# Patient Record
Sex: Male | Born: 1995 | Race: White | Hispanic: No | Marital: Married | Smoking: Never smoker
Health system: Southern US, Community
[De-identification: ages and names within clinical notes are randomized; demographics above are authoritative.]

## PROBLEM LIST (undated history)

## (undated) DIAGNOSIS — K649 Unspecified hemorrhoids: Secondary | ICD-10-CM

## (undated) HISTORY — PX: COLONOSCOPY: SHX5424

## (undated) HISTORY — DX: Unspecified hemorrhoids: K64.9

## (undated) HISTORY — PX: WISDOM TOOTH EXTRACTION: SHX21

---
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
## 2016-06-25 ENCOUNTER — Ambulatory Visit (INDEPENDENT_AMBULATORY_CARE_PROVIDER_SITE_OTHER): Payer: Managed Care, Other (non HMO) | Admitting: Emergency Medicine

## 2016-06-25 VITALS — BP 112/70 | HR 61 | Temp 98.5°F | Resp 20 | Ht 71.0 in | Wt 173.0 lb

## 2016-06-25 DIAGNOSIS — Z0000 Encounter for general adult medical examination without abnormal findings: Secondary | ICD-10-CM | POA: Diagnosis not present

## 2016-06-25 NOTE — Progress Notes (Signed)
Randy Mayer 21 y.o.   Chief Complaint  Patient presents with  . Annual Exam    [facility] [employer]    HISTORY OF PRESENT ILLNESS: This is a 21 y.o. 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  HPI   Prior to Admission medications   Not on File    Not on File  There are no active problems to display for this patient.   No past medical history on file.  No past surgical history on file.  Social History   Social History  . Marital status: Single    Spouse name: N/A  . Number of children: N/A  . Years of education: N/A   Occupational History  . Not on file.   Social History Main Topics  . Smoking status: Not on file  . Smokeless tobacco: Not on file  . Alcohol use Not on file  . Drug use: Unknown  . Sexual activity: Not on file   Other Topics Concern  . Not on file   Social History Narrative  . No narrative on file    No family history on file.   Review of Systems  Constitutional: Negative.  Negative for chills, fever and weight loss.  HENT: Negative.  Negative for ear discharge, hearing loss, nosebleeds and sore throat.   Eyes: Negative.  Negative for blurred vision and double vision.  Respiratory: Negative.  Negative for cough and shortness of breath.   Cardiovascular: Negative.  Negative for chest pain and palpitations.  Gastrointestinal: Negative.  Negative for abdominal pain, diarrhea, nausea and vomiting.  Genitourinary: Negative.  Negative for dysuria and hematuria.  Musculoskeletal: Negative.  Negative for myalgias and neck pain.  Skin: Negative.  Negative for rash.  Neurological: Negative.  Negative for dizziness and headaches.  Endo/Heme/Allergies: Negative.   All other systems reviewed and are negative.  Vitals:   06/25/16 1515  BP: 112/70  Pulse: 61  Resp: 20  Temp: 98.5 F (36.9 C)     Physical Exam  Constitutional: He is oriented to person, place, and time. He appears well-developed and  well-nourished.  HENT:  Head: Normocephalic and atraumatic.  Nose: Nose normal.  Mouth/Throat: Oropharynx is clear and moist. No oropharyngeal exudate.  Eyes: Conjunctivae and EOM are normal. Pupils are equal, round, and reactive to light.  Neck: Normal range of motion. Neck supple. No JVD present.  Cardiovascular: Normal rate, regular rhythm, normal heart sounds and intact distal pulses.   Pulmonary/Chest: Effort normal and breath sounds normal.  Abdominal: Soft. Bowel sounds are normal. There is no tenderness.  Musculoskeletal: Normal range of motion.  Lymphadenopathy:    He has no cervical adenopathy.  Neurological: He is alert and oriented to person, place, and time. No sensory deficit. He exhibits normal muscle tone.  Skin: Skin is warm and dry. Capillary refill takes less than 2 seconds. No rash noted.  Psychiatric: He has a normal mood and affect. His behavior is normal.  Vitals reviewed.    ASSESSMENT & PLAN: Randy Mayer was seen today for annual exam.  Diagnoses and all orders for this visit:  Routine general medical examination at a health care facility    Patient Instructions       IF you received an x-ray today, you will receive an invoice from [facility]. Please contact [facility] at [phone] with questions or concerns regarding your invoice.   IF you received labwork today, you will receive an invoice from [facility]. Please contact [facility] at [phone]  with questions or concerns regarding your invoice.   Our billing staff will not be able to assist you with questions regarding bills from these companies.  You will be contacted with the lab results as soon as they are available. The fastest way to get your results is to activate your My Chart account. Instructions are located on the last page of this paperwork. If you have not heard from [place] regarding the results in 2 weeks, please contact this office.        Health Maintenance,  Male A healthy lifestyle and preventive care is important for your health and wellness. Ask your health care provider about what schedule of regular examinations is right for you. What should I know about weight and diet?  Eat a Healthy Diet  Eat plenty of vegetables, fruits, whole grains, low-fat dairy products, and lean protein.  Do not eat a lot of foods high in solid fats, added sugars, or salt. Maintain a Healthy Weight  Regular exercise can help you achieve or maintain a healthy weight. You should:  Do at least 150 minutes of exercise each week. The exercise should increase your heart rate and make you sweat (moderate-intensity exercise).  Do strength-training exercises at least twice a week. Watch Your Levels of Cholesterol and Blood Lipids  Have your blood tested for lipids and cholesterol every 5 years starting at 23 years of age. If you are at high risk for heart disease, you should start having your blood tested when you are 23 years old. You may need to have your cholesterol levels checked more often if:  Your lipid or cholesterol levels are high.  You are older than 23 years of age.  You are at high risk for heart disease. What should I know about cancer screening? Many types of cancers can be detected early and may often be prevented. Lung Cancer  You should be screened every year for lung cancer if:  You are a current smoker who has smoked for at least 30 years.  You are a former smoker who has quit within the past 15 years.  Talk to your health care provider about your screening options, when you should start screening, and how often you should be screened. Colorectal Cancer  Routine colorectal cancer screening usually begins at 23 years of age and should be repeated every 5-10 years until you are 23 years old. You may need to be screened more often if early forms of precancerous polyps or small growths are found. Your health care provider may recommend screening  at an earlier age if you have risk factors for colon cancer.  Your health care provider may recommend using home test kits to check for hidden blood in the stool.  A small camera at the end of a tube can be used to examine your colon (sigmoidoscopy or colonoscopy). This checks for the earliest forms of colorectal cancer. Prostate and Testicular Cancer  Depending on your age and overall health, your health care provider may do certain tests to screen for prostate and testicular cancer.  Talk to your health care provider about any symptoms or concerns you have about testicular or prostate cancer. Skin Cancer  Check your skin from head to toe regularly.  Tell your health care provider about any new moles or changes in moles, especially if:  There is a change in a mole's size, shape, or color.  You have a mole that is larger than a pencil eraser.  Always use sunscreen. Apply  sunscreen liberally and repeat throughout the day.  Protect yourself by wearing long sleeves, pants, a wide-brimmed hat, and sunglasses when outside. What should I know about heart disease, diabetes, and high blood pressure?  If you are 50-78 years of age, have your blood pressure checked every 3-5 years. If you are 64 years of age or older, have your blood pressure checked every year. You should have your blood pressure measured twice-once when you are at a hospital or clinic, and once when you are not at a hospital or clinic. Record the average of the two measurements. To check your blood pressure when you are not at a hospital or clinic, you can use:  An automated blood pressure machine at a pharmacy.  A home blood pressure monitor.  Talk to your health care provider about your target blood pressure.  If you are between 36-14 years old, ask your health care provider if you should take aspirin to prevent heart disease.  Have regular diabetes screenings by checking your fasting blood sugar level.  If you are at  a normal weight and have a low risk for diabetes, have this test once every three years after the age of 35.  If you are overweight and have a high risk for diabetes, consider being tested at a younger age or more often.  A one-time screening for abdominal aortic aneurysm (AAA) by ultrasound is recommended for men aged 65-75 years who are current or former smokers. What should I know about preventing infection? Hepatitis B  If you have a higher risk for hepatitis B, you should be screened for this virus. Talk with your health care provider to find out if you are at risk for hepatitis B infection. Hepatitis C  Blood testing is recommended for:  Everyone born from [birth year] through 1967.  Anyone with known risk factors for hepatitis C. Sexually Transmitted Diseases (STDs)  You should be screened each year for STDs including gonorrhea and chlamydia if:  You are sexually active and are younger than 23 years of age.  You are older than 23 years of age and your health care provider tells you that you are at risk for this type of infection.  Your sexual activity has changed since you were last screened and you are at an increased risk for chlamydia or gonorrhea. Ask your health care provider if you are at risk.  Talk with your health care provider about whether you are at high risk of being infected with HIV. Your health care provider may recommend a prescription medicine to help prevent HIV infection. What else can I do?  Schedule regular health, dental, and eye exams.  Stay current with your vaccines (immunizations).  Do not use any tobacco products, such as cigarettes, chewing tobacco, and e-cigarettes. If you need help quitting, ask your health care provider.  Limit alcohol intake to no more than 2 drinks per day. One drink equals 12 ounces of beer, 5 ounces of wine, or 1 ounces of hard liquor.  Do not use street drugs.  Do not share needles.  Ask your health care provider for help if  you need support or information about quitting drugs.  Tell your health care provider if you often feel depressed.  Tell your health care provider if you have ever been abused or do not feel safe at home. This information is not intended to replace advice given to you by your health care provider. Make sure you discuss any questions you have with your  health care provider. Document Released: 07/19/2007 Document Revised: 09/19/2015 Document Reviewed: 10/24/2014 Elsevier Interactive Patient Education  2017 Elsevier Inc.  American Heart Association [facility](AHA) Exercise Recommendation  Being physically active is important to prevent heart disease and stroke, the nation's No. 1and No. 5killers. To improve overall cardiovascular health, we suggest at least 150 minutes per week of moderate exercise or 75 minutes per week of vigorous exercise (or a combination of moderate and vigorous activity). Thirty minutes a day, five times a week is an easy goal to remember. You will also experience benefits even if you divide your time into two or three segments of 10 to 15 minutes per day.  For people who would benefit from lowering their blood pressure or cholesterol, we recommend 40 minutes of aerobic exercise of moderate to vigorous intensity three to four times a week to lower the risk for heart attack and stroke.  Physical activity is anything that makes you move your body and burn calories.  This includes things like climbing stairs or playing sports. Aerobic exercises benefit your heart, and include walking, jogging, swimming or biking. Strength and stretching exercises are best for overall stamina and flexibility.  The simplest, positive change you can make to effectively improve your heart health is to start walking. It's enjoyable, free, easy, social and great exercise. A walking program is flexible and boasts high success rates because people can stick with it. It's easy for walking to become a regular and  satisfying part of life.   For Overall Cardiovascular Health:  At least 30 minutes of moderate-intensity aerobic activity at least 5 days per week for a total of 150  OR   At least 25 minutes of vigorous aerobic activity at least 3 days per week for a total of 75 minutes; or a combination of moderate- and vigorous-intensity aerobic activity  AND   Moderate- to high-intensity muscle-strengthening activity at least 2 days per week for additional health benefits.  For Lowering Blood Pressure and Cholesterol  An average 40 minutes of moderate- to vigorous-intensity aerobic activity 3 or 4 times per week  What if I can't make it to the time goal? Something is always better than nothing! And everyone has to start somewhere. Even if you've been sedentary for years, today is the day you can begin to make healthy changes in your life. If you don't think you'll make it for 30 or 40 minutes, set a reachable goal for today. You can work up toward your overall goal by increasing your time as you get stronger. Don't let all-or-nothing thinking rob you of doing what you can every day.  Source:http://www.heart.[place]        Jazlin Tapscott, MD [facility]

## 2016-06-25 NOTE — Patient Instructions (Addendum)
   IF you received an x-ray today, you will receive an invoice from [facility]. Please contact [facility] at [phone] with questions or concerns regarding your invoice.   IF you received labwork today, you will receive an invoice from [facility]. Please contact [facility] at [phone] with questions or concerns regarding your invoice.   Our billing staff will not be able to assist you with questions regarding bills from these companies.  You will be contacted with the lab results as soon as they are available. The fastest way to get your results is to activate your My Chart account. Instructions are located on the last page of this paperwork. If you have not heard from us regarding the results in 2 weeks, please contact this office.        Health Maintenance, Male A healthy lifestyle and preventive care is important for your health and wellness. Ask your health care provider about what schedule of regular examinations is right for you. What should I know about weight and diet?  Eat a Healthy Diet  Eat plenty of vegetables, fruits, whole grains, low-fat dairy products, and lean protein.  Do not eat a lot of foods high in solid fats, added sugars, or salt. Maintain a Healthy Weight  Regular exercise can help you achieve or maintain a healthy weight. You should:  Do at least 150 minutes of exercise each week. The exercise should increase your heart rate and make you sweat (moderate-intensity exercise).  Do strength-training exercises at least twice a week. Watch Your Levels of Cholesterol and Blood Lipids  Have your blood tested for lipids and cholesterol every 5 years starting at 23 years of age. If you are at high risk for heart disease, you should start having your blood tested when you are 23 years old. You may need to have your cholesterol levels checked more often if:  Your lipid or cholesterol levels are high.  You are older than 23 years of  age.  You are at high risk for heart disease. What should I know about cancer screening? Many types of cancers can be detected early and may often be prevented. Lung Cancer  You should be screened every year for lung cancer if:  You are a current smoker who has smoked for at least 30 years.  You are a former smoker who has quit within the past 15 years.  Talk to your health care provider about your screening options, when you should start screening, and how often you should be screened. Colorectal Cancer  Routine colorectal cancer screening usually begins at 23 years of age and should be repeated every 5-10 years until you are 23 years old. You may need to be screened more often if early forms of precancerous polyps or small growths are found. Your health care provider may recommend screening at an earlier age if you have risk factors for colon cancer.  Your health care provider may recommend using home test kits to check for hidden blood in the stool.  A small camera at the end of a tube can be used to examine your colon (sigmoidoscopy or colonoscopy). This checks for the earliest forms of colorectal cancer. Prostate and Testicular Cancer  Depending on your age and overall health, your health care provider may do certain tests to screen for prostate and testicular cancer.  Talk to your health care provider about any symptoms or concerns you have about testicular or prostate cancer. Skin Cancer  Check your skin from head   to toe regularly.  Tell your health care provider about any new moles or changes in moles, especially if:  There is a change in a mole's size, shape, or color.  You have a mole that is larger than a pencil eraser.  Always use sunscreen. Apply sunscreen liberally and repeat throughout the day.  Protect yourself by wearing long sleeves, pants, a wide-brimmed hat, and sunglasses when outside. What should I know about heart disease, diabetes, and high blood  pressure?  If you are 18-39 years of age, have your blood pressure checked every 3-5 years. If you are 40 years of age or older, have your blood pressure checked every year. You should have your blood pressure measured twice-once when you are at a hospital or clinic, and once when you are not at a hospital or clinic. Record the average of the two measurements. To check your blood pressure when you are not at a hospital or clinic, you can use:  An automated blood pressure machine at a pharmacy.  A home blood pressure monitor.  Talk to your health care provider about your target blood pressure.  If you are between 45-79 years old, ask your health care provider if you should take aspirin to prevent heart disease.  Have regular diabetes screenings by checking your fasting blood sugar level.  If you are at a normal weight and have a low risk for diabetes, have this test once every three years after the age of 45.  If you are overweight and have a high risk for diabetes, consider being tested at a younger age or more often.  A one-time screening for abdominal aortic aneurysm (AAA) by ultrasound is recommended for men aged 65-75 years who are current or former smokers. What should I know about preventing infection? Hepatitis B  If you have a higher risk for hepatitis B, you should be screened for this virus. Talk with your health care provider to find out if you are at risk for hepatitis B infection. Hepatitis C  Blood testing is recommended for:  Everyone born from 1945 through 1965.  Anyone with known risk factors for hepatitis C. Sexually Transmitted Diseases (STDs)  You should be screened each year for STDs including gonorrhea and chlamydia if:  You are sexually active and are younger than 24 years of age.  You are older than 24 years of age and your health care provider tells you that you are at risk for this type of infection.  Your sexual activity has changed since you were last  screened and you are at an increased risk for chlamydia or gonorrhea. Ask your health care provider if you are at risk.  Talk with your health care provider about whether you are at high risk of being infected with HIV. Your health care provider may recommend a prescription medicine to help prevent HIV infection. What else can I do?  Schedule regular health, dental, and eye exams.  Stay current with your vaccines (immunizations).  Do not use any tobacco products, such as cigarettes, chewing tobacco, and e-cigarettes. If you need help quitting, ask your health care provider.  Limit alcohol intake to no more than 2 drinks per day. One drink equals 12 ounces of beer, 5 ounces of wine, or 1 ounces of hard liquor.  Do not use street drugs.  Do not share needles.  Ask your health care provider for help if you need support or information about quitting drugs.  Tell your health care provider if you   often feel depressed.  Tell your health care provider if you have ever been abused or do not feel safe at home. This information is not intended to replace advice given to you by your health care provider. Make sure you discuss any questions you have with your health care provider. Document Released: 07/19/2007 Document Revised: 09/19/2015 Document Reviewed: 10/24/2014 Elsevier Interactive Patient Education  2017 Elsevier Inc.  American Heart Association (AHA) Exercise Recommendation  Being physically active is important to prevent heart disease and stroke, the nation's No. 1and No. 5killers. To improve overall cardiovascular health, we suggest at least 150 minutes per week of moderate exercise or 75 minutes per week of vigorous exercise (or a combination of moderate and vigorous activity). Thirty minutes a day, five times a week is an easy goal to remember. You will also experience benefits even if you divide your time into two or three segments of 10 to 15 minutes per day.  For people who would  benefit from lowering their blood pressure or cholesterol, we recommend 40 minutes of aerobic exercise of moderate to vigorous intensity three to four times a week to lower the risk for heart attack and stroke.  Physical activity is anything that makes you move your body and burn calories.  This includes things like climbing stairs or playing sports. Aerobic exercises benefit your heart, and include walking, jogging, swimming or biking. Strength and stretching exercises are best for overall stamina and flexibility.  The simplest, positive change you can make to effectively improve your heart health is to start walking. It's enjoyable, free, easy, social and great exercise. A walking program is flexible and boasts high success rates because people can stick with it. It's easy for walking to become a regular and satisfying part of life.   For Overall Cardiovascular Health:  At least 30 minutes of moderate-intensity aerobic activity at least 5 days per week for a total of 150  OR   At least 25 minutes of vigorous aerobic activity at least 3 days per week for a total of 75 minutes; or a combination of moderate- and vigorous-intensity aerobic activity  AND   Moderate- to high-intensity muscle-strengthening activity at least 2 days per week for additional health benefits.  For Lowering Blood Pressure and Cholesterol  An average 40 minutes of moderate- to vigorous-intensity aerobic activity 3 or 4 times per week  What if I can't make it to the time goal? Something is always better than nothing! And everyone has to start somewhere. Even if you've been sedentary for years, today is the day you can begin to make healthy changes in your life. If you don't think you'll make it for 30 or 40 minutes, set a reachable goal for today. You can work up toward your overall goal by increasing your time as you get stronger. Don't let all-or-nothing thinking rob you of doing what you can every day.   Source:http://www.heart.org    

## 2018-02-11 ENCOUNTER — Telehealth: Payer: Self-pay | Admitting: General Practice

## 2018-02-11 NOTE — Telephone Encounter (Signed)
Copied from CRM [id number]. Topic: Appointment Scheduling - Scheduling Inquiry for Clinic >> Feb 11, 2018  2:59 PM Crist Infante wrote: Reason for CRM: Dad is a pt of Dr Jimmey Ralph.  Pt is experiencing IBS sx. Pt is in a specialized training program with the [place] [employer], and his schedule varies.  But he has tomorrow morning off. Dad wants to know if Dr Jimmey Ralph will see him tomorrow in the 11:20 time slot.  That is all that is open.  (it is a same day) Pt has to be at work at pm, so earlier would be better.  Dad states it is is getting worse every day for the pt. (dad not with him now) was hoping Dr Jimmey Ralph could see him though. pt does not know when he will get another day off.

## 2018-02-12 ENCOUNTER — Ambulatory Visit: Payer: Self-pay | Admitting: Family Medicine

## 2019-12-20 ENCOUNTER — Ambulatory Visit: Payer: Self-pay | Admitting: Family Medicine

## 2020-02-04 DIAGNOSIS — K589 Irritable bowel syndrome without diarrhea: Secondary | ICD-10-CM

## 2020-02-04 HISTORY — DX: Irritable bowel syndrome without diarrhea: K58.9

## 2020-03-01 ENCOUNTER — Encounter: Payer: Self-pay | Admitting: Family Medicine

## 2020-03-01 ENCOUNTER — Other Ambulatory Visit: Payer: Self-pay

## 2020-03-01 ENCOUNTER — Ambulatory Visit (INDEPENDENT_AMBULATORY_CARE_PROVIDER_SITE_OTHER): Payer: Managed Care, Other (non HMO) | Admitting: Family Medicine

## 2020-03-01 VITALS — BP 106/69 | HR 63 | Temp 97.9°F | Ht 71.0 in | Wt 167.6 lb

## 2020-03-01 DIAGNOSIS — R197 Diarrhea, unspecified: Secondary | ICD-10-CM | POA: Diagnosis not present

## 2020-03-01 DIAGNOSIS — Z13220 Encounter for screening for lipoid disorders: Secondary | ICD-10-CM

## 2020-03-01 DIAGNOSIS — Z0001 Encounter for general adult medical examination with abnormal findings: Secondary | ICD-10-CM

## 2020-03-01 DIAGNOSIS — K580 Irritable bowel syndrome with diarrhea: Secondary | ICD-10-CM | POA: Insufficient documentation

## 2020-03-01 LAB — CBC
HCT: 44.2 % (ref 39.0–52.0)
Hemoglobin: 14.9 g/dL (ref 13.0–17.0)
MCHC: 33.8 g/dL (ref 30.0–36.0)
MCV: 92.4 fl (ref 78.0–100.0)
Platelets: 157 10*3/uL (ref 150.0–400.0)
RBC: 4.79 Mil/uL (ref 4.22–5.81)
RDW: 12.8 % (ref 11.5–15.5)
WBC: 3.5 10*3/uL — ABNORMAL LOW (ref 4.0–10.5)

## 2020-03-01 LAB — COMPREHENSIVE METABOLIC PANEL
ALT: 21 U/L (ref 0–53)
AST: 20 U/L (ref 0–37)
Albumin: 4.4 g/dL (ref 3.5–5.2)
Alkaline Phosphatase: 65 U/L (ref 39–117)
BUN: 29 mg/dL — ABNORMAL HIGH (ref 6–23)
CO2: 30 mEq/L (ref 19–32)
Calcium: 9.6 mg/dL (ref 8.4–10.5)
Chloride: 104 mEq/L (ref 96–112)
Creatinine, Ser: 1.09 mg/dL (ref 0.40–1.50)
GFR: 93.86 mL/min (ref >60.00)
Glucose, Bld: 88 mg/dL (ref 70–99)
Potassium: 3.9 mEq/L (ref 3.5–5.1)
Sodium: 140 mEq/L (ref 135–145)
Total Bilirubin: 0.8 mg/dL (ref 0.2–1.2)
Total Protein: 6.7 g/dL (ref 6.0–8.3)

## 2020-03-01 LAB — LIPID PANEL
Cholesterol: 175 mg/dL (ref 0–200)
HDL: 56.5 mg/dL (ref >39.00)
LDL Cholesterol: 98 mg/dL (ref 0–99)
NonHDL: 118.57
Total CHOL/HDL Ratio: 3
Triglycerides: 102 mg/dL (ref 0.0–149.0)
VLDL: 20.4 mg/dL (ref 0.0–40.0)

## 2020-03-01 LAB — TSH: TSH: 8.02 u[IU]/mL — ABNORMAL HIGH (ref 0.35–4.50)

## 2020-03-01 NOTE — Assessment & Plan Note (Signed)
Probable IBS.  Will check labs today.  Recommended FODMAP eating plan and peppermint oil.  Given his unintentional weight loss will also place referral to GI.

## 2020-03-01 NOTE — Progress Notes (Signed)
Chief Complaint:  Randy Mayer is a 25 y.o. male who presents today for his annual comprehensive physical exam.    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  Will check labs today.  Recommended FODMAP eating plan and peppermint oil.  Given his unintentional weight loss will also place referral to GI.  Preventative Healthcare: Check labs today.  Patient Counseling(The following topics were reviewed and/or handout was given):  -Nutrition: Stressed importance of moderation in sodium/caffeine intake, saturated fat and cholesterol, caloric balance, sufficient intake of fresh fruits, vegetables, and fiber.  -Stressed the importance of regular exercise.   -Substance Abuse: Discussed cessation/primary prevention of tobacco, alcohol, or other drug use; driving or other dangerous activities under the influence; availability of treatment for abuse.   -Injury prevention: Discussed safety belts, safety helmets, smoke detector, smoking near bedding or upholstery.   -Sexuality: Discussed sexually transmitted diseases, partner selection, use of condoms, avoidance of unintended pregnancy and contraceptive alternatives.   -Dental health: Discussed importance of regular tooth brushing, flossing, and dental visits.  -Health maintenance and immunizations reviewed. Please refer to Health maintenance section.  Return to care in 1 year for next preventative visit.     Subjective:  HPI:  He has no acute complaints today.  He has had some concern for IBS for the last several years.  Several times per week he will wake up in the morning with abdominal pain and have episode of diarrhea.  He has not noticed any blood or mucus in the stool.  No fevers or chills.  He has had about a 15 pound unintentional weight loss over the last several months.  He thought this may have been due to COVID but his other symptoms have resolved and he has had a difficult time putting weight back  on.  Lifestyle Diet: Balanced.  Exercise: Goes to gym regularly.   Depression screen PHQ 2/9 03/01/2020  Decreased Interest 0  Down, Depressed, Hopeless 0  PHQ - 2 Score 0    Health Maintenance Due  Topic Date Due  . Hepatitis C Screening  Never done  . HIV Screening  Never done     ROS: Per HPI, otherwise a complete review of systems was negative.   PMH:  The following were reviewed and entered/updated in epic: History reviewed. No pertinent past medical history. Patient Active Problem List   Diagnosis Date Noted  . Diarrhea 03/01/2020   History reviewed. No pertinent surgical history.  History reviewed. No pertinent family history.  Medications- reviewed and updated No current outpatient medications on file.   No current facility-administered medications for this visit.    Allergies-reviewed and updated No Known Allergies  Social History   Socioeconomic History  . Marital status: Single    Spouse name: Not on file  . Number of children: Not on file  . Years of education: Not on file  . Highest education level: Not on file  Occupational History  . Not on file  Tobacco Use  . Smoking status: Not on file  . Smokeless tobacco: Not on file  Substance and Sexual Activity  . Alcohol use: Yes    Comment: OCC  . Drug use: Never  . Sexual activity: Not on file  Other Topics Concern  . Not on file  Social History Narrative  . Not on file   Social Determinants of Health   Financial Resource Strain: Not on file  Food Insecurity: Not on file  Transportation Needs: Not on file  Physical Activity: Not on file  Stress: Not on file  Social Connections: Not on file        Objective:  Physical Exam: BP 106/69   Pulse 63   Temp 97.9 F (36.6 C) (Temporal)   Ht 5\' 11"  (1.803 m)   Wt 167 lb 9.6 oz (76 kg)   SpO2 99%   BMI 23.38 kg/m   Body mass index is 23.38 kg/m. Wt Readings from Last 3 Encounters:  03/01/20 167 lb 9.6 oz (76 kg)  06/25/16 173 lb  (78.5 kg)   Gen: NAD, resting comfortably HEENT: TMs normal bilaterally. OP clear. No thyromegaly noted.  CV: RRR with no murmurs appreciated Pulm: NWOB, CTAB with no crackles, wheezes, or rhonchi GI: Normal bowel sounds present. Soft, Nontender, Nondistended. MSK: no edema, cyanosis, or clubbing noted Skin: warm, dry Neuro: CN2-12 grossly intact. Strength 5/5 in upper and lower extremities. Reflexes symmetric and intact bilaterally.  Psych: Normal affect and thought content     Randy M. 06/27/16, MD 03/01/2020 9:20 AM

## 2020-03-01 NOTE — Patient Instructions (Signed)
It was very nice to see you today!  We will check blood work today and place referral for you to see a gastroenterologist.  Please try the FODMAP eating plan below.  Please also try taking peppermint oil to see if this helps with your digestive issues. You can try IBgard to see if it helps.  I will see you back in year for your next annual checkup.  Please come back to see me sooner if needed.  Take care, Dr Jimmey Ralph  Please try these tips to maintain a healthy lifestyle:   Eat at least 3 REAL meals and 1-2 snacks per day.  Aim for no more than 5 hours between eating.  If you eat breakfast, please do so within one hour of getting up.    Each meal should contain half fruits/vegetables, one quarter protein, and one quarter carbs (no bigger than a computer mouse)   Cut down on sweet beverages. This includes juice, soda, and sweet tea.     Drink at least 1 glass of water with each meal and aim for at least 8 glasses per day   Exercise at least 150 minutes every week.    Preventive Care 67-21 Years Old, Male Preventive care refers to lifestyle choices and visits with your health care provider that can promote health and wellness. This includes:  A yearly physical exam. This is also called an annual wellness visit.  Regular dental and eye exams.  Immunizations.  Screening for certain conditions.  Healthy lifestyle choices, such as: ? Eating a healthy diet. ? Getting regular exercise. ? Not using drugs or products that contain nicotine and tobacco. ? Limiting alcohol use. What can I expect for my preventive care visit? Physical exam Your health care provider may check your:  Height and weight. These may be used to calculate your BMI (body mass index). BMI is a measurement that tells if you are at a healthy weight.  Heart rate and blood pressure.  Body temperature.  Skin for abnormal spots. Counseling Your health care provider may ask you questions about your:  Past  medical problems.  Family's medical history.  Alcohol, tobacco, and drug use.  Emotional well-being.  Home life and relationship well-being.  Sexual activity.  Diet, exercise, and sleep habits.  Work and work Astronomer.  Access to firearms. What immunizations do I need? Vaccines are usually given at various ages, according to a schedule. Your health care provider will recommend vaccines for you based on your age, medical history, and lifestyle or other factors, such as travel or where you work.   What tests do I need? Blood tests  Lipid and cholesterol levels. These may be checked every 5 years starting at age 59.  Hepatitis C test.  Hepatitis B test. Screening  Diabetes screening. This is done by checking your blood sugar (glucose) after you have not eaten for a while (fasting).  Genital exam to check for testicular cancer or hernias.  STD (sexually transmitted disease) testing, if you are at risk. Talk with your health care provider about your test results, treatment options, and if necessary, the need for more tests.   Follow these instructions at home: Eating and drinking  Eat a healthy diet that includes fresh fruits and vegetables, whole grains, lean protein, and low-fat dairy products.  Drink enough fluid to keep your urine pale yellow.  Take vitamin and mineral supplements as recommended by your health care provider.  Do not drink alcohol if your health care provider  tells you not to drink.  If you drink alcohol: ? Limit how much you have to 0-2 drinks a day. ? Be aware of how much alcohol is in your drink. In the [place] one drink equals one 12 oz bottle of beer (355 mL), one 5 oz glass of wine (148 mL), or one 1 oz glass of hard liquor (44 mL).   Lifestyle  Take daily care of your teeth and gums. Brush your teeth every morning and night with fluoride toothpaste. Floss one time each day.  Stay active. Exercise for at least 30 minutes 5 or more days each  week.  Do not use any products that contain nicotine or tobacco, such as cigarettes, e-cigarettes, and chewing tobacco. If you need help quitting, ask your health care provider.  Do not use drugs.  If you are sexually active, practice safe sex. Use a condom or other form of protection to prevent STIs (sexually transmitted infections).  Find healthy ways to cope with stress, such as: ? Meditation, yoga, or listening to music. ? Journaling. ? Talking to a trusted person. ? Spending time with friends and family. Safety  Always wear your seat belt while driving or riding in a vehicle.  Do not drive: ? If you have been drinking alcohol. Do not ride with someone who has been drinking. ? When you are tired or distracted. ? While texting.  Wear a helmet and other protective equipment during sports activities.  If you have firearms in your house, make sure you follow all gun safety procedures.  Seek help if you have been physically or sexually abused. What's next?  Go to your health care provider once a year for an annual wellness visit.  Ask your health care provider how often you should have your eyes and teeth checked.  Stay up to date on all vaccines. This information is not intended to replace advice given to you by your health care provider. Make sure you discuss any questions you have with your health care provider. Document Revised: 10/06/2018 Document Reviewed: 01/14/2018 Elsevier Patient Education  2021 Elsevier Inc.   Low-FODMAP Eating Plan  FODMAP stands for fermentable oligosaccharides, disaccharides, monosaccharides, and polyols. These are sugars that are hard for some people to digest. A low-FODMAP eating plan may help some people who have irritable bowel syndrome (IBS) and certain other bowel (intestinal) diseases to manage their symptoms. This meal plan can be complicated to follow. Work with a diet and nutrition specialist (dietitian) to make a low-FODMAP eating  plan that is right for you. A dietitian can help make sure that you get enough nutrition from this diet. What are tips for following this plan? Reading food labels  Check labels for hidden FODMAPs such as: ? High-fructose syrup. ? Honey. ? Agave. ? Natural fruit flavors. ? Onion or garlic powder.  Choose low-FODMAP foods that contain 3-4 grams of fiber per serving.  Check food labels for serving sizes. Eat only one serving at a time to make sure FODMAP levels stay low. Shopping  Shop with a list of foods that are recommended on this diet and make a meal plan. Meal planning  Follow a low-FODMAP eating plan for up to 6 weeks, or as told by your health care provider or dietitian.  To follow the eating plan: 1. Eliminate high-FODMAP foods from your diet completely. Choose only low-FODMAP foods to eat. You will do this for 2-6 weeks. 2. Gradually reintroduce high-FODMAP foods into your diet one at a time.  Most people should wait a few days before introducing the next new high-FODMAP food into their meal plan. Your dietitian can recommend how quickly you may reintroduce foods. 3. Keep a daily record of what and how much you eat and drink. Make note of any symptoms that you have after eating. 4. Review your daily record with a dietitian regularly to identify which foods you can eat and which foods you should avoid. General tips  Drink enough fluid each day to keep your urine pale yellow.  Avoid processed foods. These often have added sugar and may be high in FODMAPs.  Avoid most dairy products, whole grains, and sweeteners.  Work with a dietitian to make sure you get enough fiber in your diet.  Avoid high FODMAP foods at meals to manage symptoms. Recommended foods Fruits Bananas, oranges, tangerines, lemons, limes, blueberries, raspberries, strawberries, grapes, cantaloupe, honeydew melon, kiwi, papaya, passion fruit, and pineapple. Limited amounts of dried cranberries, banana chips,  and shredded coconut. Vegetables Eggplant, zucchini, cucumber, peppers, green beans, bean sprouts, lettuce, arugula, kale, Swiss chard, spinach, collard greens, bok choy, summer squash, potato, and tomato. Limited amounts of corn, carrot, and sweet potato. Green parts of scallions. Grains Gluten-free grains, such as rice, oats, buckwheat, quinoa, corn, polenta, and millet. Gluten-free pasta, bread, or cereal. Rice noodles. Corn tortillas. Meats and other proteins Unseasoned beef, pork, poultry, or fish. Eggs. Tomasa Blase. Tofu (firm) and tempeh. Limited amounts of nuts and seeds, such as almonds, walnuts, [place] nuts, pecans, peanuts, nut butters, pumpkin seeds, chia seeds, and sunflower seeds. Dairy Lactose-free milk, yogurt, and kefir. Lactose-free cottage cheese and ice cream. Non-dairy milks, such as almond, coconut, hemp, and rice milk. Non-dairy yogurt. Limited amounts of goat cheese, brie, mozzarella, parmesan, swiss, and other hard cheeses. Fats and oils Butter-free spreads. Vegetable oils, such as olive, canola, and sunflower oil. Seasoning and other foods Artificial sweeteners with names that do not end in "ol," such as aspartame, saccharine, and stevia. Maple syrup, white table sugar, raw sugar, brown sugar, and molasses. Mayonnaise, soy sauce, and tamari. Fresh basil, coriander, parsley, rosemary, and thyme. Beverages Water and mineral water. Sugar-sweetened soft drinks. Small amounts of orange juice or cranberry juice. Black and green tea. Most dry wines. Coffee. The items listed above may not be a complete list of foods and beverages you can eat. Contact a dietitian for more information. Foods to avoid Fruits Fresh, dried, and juiced forms of apple, pear, watermelon, peach, plum, cherries, apricots, blackberries, boysenberries, figs, nectarines, and mango. Avocado. Vegetables Chicory root, artichoke, asparagus, cabbage, snow peas, Brussels sprouts, broccoli, sugar snap peas, mushrooms,  celery, and cauliflower. Onions, garlic, leeks, and the white part of scallions. Grains Wheat, including kamut, durum, and semolina. Barley and bulgur. Couscous. Wheat-based cereals. Wheat noodles, bread, crackers, and pastries. Meats and other proteins Fried or fatty meat. Sausage. Cashews and pistachios. Soybeans, baked beans, black beans, chickpeas, kidney beans, fava beans, navy beans, lentils, black-eyed peas, and split peas. Dairy Milk, yogurt, ice cream, and soft cheese. Cream and sour cream. Milk-based sauces. Custard. Buttermilk. Soy milk. Seasoning and other foods Any sugar-free gum or candy. Foods that contain artificial sweeteners such as sorbitol, mannitol, isomalt, or xylitol. Foods that contain honey, high-fructose corn syrup, or agave. Bouillon, vegetable stock, beef stock, and chicken stock. Garlic and onion powder. Condiments made with onion, such as hummus, chutney, pickles, relish, salad dressing, and salsa. Tomato paste. Beverages Chicory-based drinks. Coffee substitutes. Chamomile tea. Fennel tea. Sweet or fortified wines such as port  or sherry. Diet soft drinks made with isomalt, mannitol, maltitol, sorbitol, or xylitol. Apple, pear, and mango juice. Juices with high-fructose corn syrup. The items listed above may not be a complete list of foods and beverages you should avoid. Contact a dietitian for more information. Summary  FODMAP stands for fermentable oligosaccharides, disaccharides, monosaccharides, and polyols. These are sugars that are hard for some people to digest.  A low-FODMAP eating plan is a short-term diet that helps to ease symptoms of certain bowel diseases.  The eating plan usually lasts up to 6 weeks. After that, high-FODMAP foods are reintroduced gradually and one at a time. This can help you find out which foods may be causing symptoms.  A low-FODMAP eating plan can be complicated. It is best to work with a dietitian who has experience with this type of  plan. This information is not intended to replace advice given to you by your health care provider. Make sure you discuss any questions you have with your health care provider. Document Revised: 06/09/2019 Document Reviewed: 06/09/2019 Elsevier Patient Education  2021 [employer].

## 2020-03-02 ENCOUNTER — Other Ambulatory Visit: Payer: Self-pay | Admitting: *Deleted

## 2020-03-02 ENCOUNTER — Other Ambulatory Visit (INDEPENDENT_AMBULATORY_CARE_PROVIDER_SITE_OTHER): Payer: Managed Care, Other (non HMO)

## 2020-03-02 ENCOUNTER — Other Ambulatory Visit: Payer: Managed Care, Other (non HMO)

## 2020-03-02 ENCOUNTER — Other Ambulatory Visit: Payer: Self-pay

## 2020-03-02 DIAGNOSIS — Z0001 Encounter for general adult medical examination with abnormal findings: Secondary | ICD-10-CM

## 2020-03-02 DIAGNOSIS — R946 Abnormal results of thyroid function studies: Secondary | ICD-10-CM

## 2020-03-02 NOTE — Progress Notes (Signed)
Please inform patient of the following:  Thyroid level is off. That could explain some of his symptoms. Everything else is NORMAL.  Would like for him to come back in to recheck. Please place order for TSH, free t4 and free t3.  Katina Degree. Jimmey Ralph, MD 03/02/2020 1:09 PM

## 2020-03-02 NOTE — Addendum Note (Signed)
Addended by: Cleda Mccreedy F on: 03/02/2020 02:57 PM   Modules accepted: Orders

## 2020-03-05 ENCOUNTER — Telehealth: Payer: Self-pay

## 2020-03-05 LAB — ANA: Anti Nuclear Antibody (ANA): POSITIVE — AB

## 2020-03-05 LAB — ANTI-NUCLEAR AB-TITER (ANA TITER): ANA Titer 1: 1:80 {titer} — ABNORMAL HIGH

## 2020-03-05 NOTE — Telephone Encounter (Signed)
Patients mother was calling regarding lab results. 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

## 2020-03-05 NOTE — Telephone Encounter (Signed)
Please see result note.  Randy Mayer. Jimmey Ralph, MD 03/05/2020 4:09 PM

## 2020-03-05 NOTE — Progress Notes (Signed)
Please inform patient of the following:  ANA is borderline elevated. We are still waiting on his thryoid results but next step would be rheumatology referral if he is interested.   Randy Mayer. Jimmey Ralph, MD 03/05/2020 3:53 PM

## 2020-03-05 NOTE — Telephone Encounter (Signed)
See note

## 2020-03-06 NOTE — Telephone Encounter (Signed)
See results note. 

## 2020-03-12 NOTE — Telephone Encounter (Signed)
Can we check with lab? Not sure why the results havent come back yet.  Katina Degree. Jimmey Ralph, MD 03/12/2020 12:27 PM

## 2020-03-12 NOTE — Telephone Encounter (Signed)
See note

## 2020-03-12 NOTE — Telephone Encounter (Signed)
Patient's mother is calling in stating son has lost 5 more pounds and is wondering if all lab results have a call back.

## 2020-03-14 NOTE — Telephone Encounter (Signed)
Please advise of labs.

## 2020-03-15 NOTE — Telephone Encounter (Signed)
Lupita Leash from the lab  spoke with [facility] they said they sent over a tube to [facility] for the t3, and t4 I called [facility] and they are faxing over results for the ANA but no sample left to do the T3 and T4... he will need to be redrawn.

## 2020-03-21 ENCOUNTER — Telehealth: Payer: Self-pay

## 2020-03-21 NOTE — Telephone Encounter (Signed)
Schedule appointment for lab tomorrow

## 2020-03-21 NOTE — Telephone Encounter (Signed)
Pt is requesting a call back from [facility] after 1 today.

## 2020-03-21 NOTE — Telephone Encounter (Signed)
See below

## 2020-03-22 ENCOUNTER — Other Ambulatory Visit (INDEPENDENT_AMBULATORY_CARE_PROVIDER_SITE_OTHER): Payer: Managed Care, Other (non HMO)

## 2020-03-22 ENCOUNTER — Other Ambulatory Visit: Payer: Self-pay

## 2020-03-22 DIAGNOSIS — R946 Abnormal results of thyroid function studies: Secondary | ICD-10-CM | POA: Diagnosis not present

## 2020-03-22 LAB — TSH: TSH: 4.51 u[IU]/mL — ABNORMAL HIGH (ref 0.35–4.50)

## 2020-03-23 LAB — T3: T3, Total: 99 ng/dL (ref 76–181)

## 2020-03-23 LAB — T4: T4, Total: 5.7 ug/dL (ref 4.9–10.5)

## 2020-03-23 NOTE — Progress Notes (Signed)
Please inform patient of the following:  Thyroid levels are going back to NORMAL. We should recheck again in about 3-6 months but do not need to do anything else with his thryoid at this point.  Can we please make sure the rheumatology referral has been done for his positive ANA?  Katina Degree. Jimmey Ralph, MD 03/23/2020 9:35 AM

## 2020-04-17 ENCOUNTER — Ambulatory Visit: Payer: Managed Care, Other (non HMO) | Admitting: Gastroenterology

## 2020-04-17 ENCOUNTER — Other Ambulatory Visit: Payer: Self-pay

## 2020-04-17 ENCOUNTER — Encounter: Payer: Self-pay | Admitting: Gastroenterology

## 2020-04-17 ENCOUNTER — Other Ambulatory Visit (INDEPENDENT_AMBULATORY_CARE_PROVIDER_SITE_OTHER): Payer: Managed Care, Other (non HMO)

## 2020-04-17 VITALS — BP 106/62 | HR 72 | Ht 70.0 in | Wt 170.2 lb

## 2020-04-17 DIAGNOSIS — K625 Hemorrhage of anus and rectum: Secondary | ICD-10-CM | POA: Diagnosis not present

## 2020-04-17 DIAGNOSIS — K580 Irritable bowel syndrome with diarrhea: Secondary | ICD-10-CM

## 2020-04-17 DIAGNOSIS — K529 Noninfective gastroenteritis and colitis, unspecified: Secondary | ICD-10-CM

## 2020-04-17 DIAGNOSIS — R634 Abnormal weight loss: Secondary | ICD-10-CM

## 2020-04-17 DIAGNOSIS — R140 Abdominal distension (gaseous): Secondary | ICD-10-CM

## 2020-04-17 LAB — CBC
HCT: 44.8 % (ref 39.0–52.0)
Hemoglobin: 15.4 g/dL (ref 13.0–17.0)
MCHC: 34.4 g/dL (ref 30.0–36.0)
MCV: 92.5 fl (ref 78.0–100.0)
Platelets: 136 10*3/uL — ABNORMAL LOW (ref 150.0–400.0)
RBC: 4.84 Mil/uL (ref 4.22–5.81)
RDW: 12.9 % (ref 11.5–15.5)
WBC: 3.7 10*3/uL — ABNORMAL LOW (ref 4.0–10.5)

## 2020-04-17 LAB — SEDIMENTATION RATE: Sed Rate: 1 mm/hr (ref 0–15)

## 2020-04-17 LAB — HIGH SENSITIVITY CRP: CRP, High Sensitivity: 0.23 mg/L (ref 0.000–5.000)

## 2020-04-17 MED ORDER — SUPREP BOWEL PREP KIT 17.5-3.13-1.6 GM/177ML PO SOLN: 1.0000 | ORAL | 0 refills | Status: DC

## 2020-04-17 NOTE — Progress Notes (Signed)
GASTROENTEROLOGY OUTPATIENT CLINIC VISIT   Primary Care Provider Randy Mayer, [place] [place] [address] [phone]  Referring Provider Randy Mayer, [place],  [place] [address] [phone]  Patient Profile: Randy Mayer is a 25 y.o. male without significant PMH, although issues of chronic diarrhea seem to be present.  The patient presents to the [facility] for an evaluation and management of problem(s) noted below:  Problem List 1. Chronic diarrhea   2. Irritable bowel syndrome with diarrhea   3. Unintentional weight loss   4. Rectal bleeding   5. Bloating     History of Present Illness This is the patient's first visit to the outpatient [place] clinic.  The patient states that he has been experiencing episodes of lower abdominal discomfort for years.  When he was in high school and through college and now in his early adult life, he experiences episodes of bloating as well as some urgency in the morning.  Having a bowel movement can help his symptoms.  He alternates between having normal bowel movements and muddy water diarrhea between 1 and 4 times per day.  He has had weight loss in the past but is relatively stable currently.  He has periods of decreased appetite.  He has had weekly rectal bleeding of a very low volume just on the toilet paper for which she has attributed this to a fissure or hemorrhoids.  He has noted that dairy products can make his bloating worse and so he minimizes that.  He does not use any sugar substitutes.  For abdominal bloating and gas he has used Gas-X on a as needed basis but never scheduled.  He has trialed align without much success.  He has used Metamucil but no FiberCon.  There is a family history of colon polyps in his father.  He is accompanied today by his fiance who is a dietitian and helps with some of his history as well.  He is a Engineer, structural.  No history of upper or lower  endoscopy in the past.  He has never seen a gastroenterologist in the past.  He does not take significant nonsteroidals or BC/Goody powders on a regular basis.  GI Review of Systems Positive as above Negative for pyrosis, dysphagia, odynophagia, nausea, vomiting, melena  Review of Systems General: Denies fevers/chills HEENT: Denies oral lesions Cardiovascular: Denies chest pain/palpitations Pulmonary: Denies shortness of breath Gastroenterological: See HPI Genitourinary: Denies darkened urine Hematological: Denies easy bruising/bleeding Endocrine: Denies temperature intolerance Dermatological: Denies jaundice Psychological: Mood is stable   Medications Current Outpatient Medications  Medication Sig Dispense Refill  . Na Sulfate-K Sulfate-Mg Sulf (SUPREP BOWEL PREP KIT) 17.5-3.13-1.6 GM/177ML SOLN Take 1 kit by mouth as directed. For colonoscopy prep 354 mL 0   No current facility-administered medications for this visit.    Allergies No Known Allergies  Histories History reviewed. No pertinent past medical history. Past Surgical History:  Procedure Laterality Date  . WISDOM TOOTH EXTRACTION     Social History   Socioeconomic History  . Marital status: Single    Spouse name: Not on file  . Number of children: Not on file  . Years of education: Not on file  . Highest education level: Not on file  Occupational History  . Not on file  Tobacco Use  . Smoking status: Never Smoker  . Smokeless tobacco: Never Used  Vaping Use  . Vaping Use: Never used  Substance and Sexual Activity  .  Alcohol use: Yes    Comment: rare  . Drug use: Never  . Sexual activity: Not on file  Other Topics Concern  . Not on file  Social History Narrative  . Not on file   Social Determinants of Health   Financial Resource Strain: Not on file  Food Insecurity: Not on file  Transportation Needs: Not on file  Physical Activity: Not on file  Stress: Not on file  Social Connections: Not  on file  Intimate Partner Violence: Not on file   Family History  Problem Relation Age of Onset  . Arthritis Father   . Asthma Father   . Heart disease Father   . High blood pressure Father   . Colon polyps Father   . Diabetes Father   . Colon cancer Neg Hx   . Esophageal cancer Neg Hx   . Inflammatory bowel disease Neg Hx   . Liver disease Neg Hx   . Pancreatic cancer Neg Hx   . Rectal cancer Neg Hx   . Stomach cancer Neg Hx    I have reviewed his medical, social, and family history in detail and updated the electronic medical record as necessary.    PHYSICAL EXAMINATION  BP 106/62 (BP Location: Left Arm, Patient Position: Sitting, Cuff Size: Large)   Pulse 72   Ht _0  (1.778 m)   Wt 170 lb 4 oz (77.2 kg)   BMI 24.43 kg/m  Wt Readings from Last 3 Encounters:  04/17/20 170 lb 4 oz (77.2 kg)  03/01/20 167 lb 9.6 oz (76 kg)  06/25/16 173 lb (78.5 kg)  GEN: NAD, appears stated age, doesn't appear chronically ill, accompanied by fiance PSYCH: Cooperative, without pressured speech EYE: Conjunctivae pink, sclerae anicteric ENT: MMM, without oral ulcers, no erythema or exudates noted CV: Nontachycardic RESP: CTAB posteriorly, without wheezing GI: NABS, soft, NT/ND, without rebound or guarding, no HSM appreciated GU: DRE deferred as patient agrees to colonoscopy in the coming weeks MSK/EXT: No lower extremity edema SKIN: No jaundice NEURO:  Alert & Oriented x 3, no focal deficits   REVIEW OF DATA  I reviewed the following data at the time of this encounter:  GI Procedures and Studies  No relevant studies to review  Laboratory Studies  Reviewed those in epic  Imaging Studies  No relevant studies to review   ASSESSMENT  Mr. [name] is a 25 y.o. male without significant PMH, although issues of chronic diarrhea seem to be present.  The patient is seen today for evaluation and management of:  1. Chronic diarrhea   2. Irritable bowel syndrome with diarrhea   3.  Unintentional weight loss   4. Rectal bleeding   5. Bloating    The patient is hemodynamically stable.  He has a long history of alternating bowel movements but mostly tends towards diarrhea.  Patient likely has irritable bowel syndrome-the however the weight loss that he has experienced previously is not something that normally goes along with this.  Is not clear to me that inflammatory bowel disease is occurring within this patient but because of his symptoms a diagnostic upper and lower endoscopy are recommended.  We will initiate his work-up with laboratories as well as stool studies to rule out common and uncommon infections and see what his inflammatory markers look like.  We will rule out celiac disease as well as exocrine pancreas insufficiency.  May need to consider SIBO evaluation in future as well.  They are planning their upcoming wedding and  so his procedures will be scheduled after they return.  We did discuss the FODMAP diet with a plan for him not to follow this completely but to look for foods that may be causing issues but he and his future wife have already done that in regards to a food diary but will look at things once again at some point in the future.  The risks and benefits of endoscopic evaluation were discussed with the patient; these include but are not limited to the risk of perforation, infection, bleeding, missed lesions, lack of diagnosis, severe illness requiring hospitalization, as well as anesthesia and sedation related illnesses.  The patient is agreeable to proceed.  All patient questions were answered to the best of my ability, and the patient agrees to the aforementioned plan of action with follow-up as indicated.   PLAN  Laboratories as outlined below Stool studies as outlined below Diagnostic EGD (duodenal biopsies at minimum) to be scheduled Diagnostic colonoscopy (TI and colon biopsies to be obtained if possible) Consider SIBO breath testing in the future For  lower abdominal discomfort/cramping we will consider Levsin or Bentyl in future FiberCon or Metamucil once to twice daily for bulking purposes   Orders Placed This Encounter  Procedures  . Fecal leukocytes  . CBC  . Sedimentation rate  . CRP High sensitivity  . Tissue transglutaminase, IgA  . Gastrointestinal Pathogen Panel PCR  . Pancreatic elastase, fecal  . IgA  . Ambulatory referral to Gastroenterology    New Prescriptions   NA SULFATE-K SULFATE-MG SULF (SUPREP BOWEL PREP KIT) 17.5-3.13-1.6 GM/177ML SOLN    Take 1 kit by mouth as directed. For colonoscopy prep   Modified Medications   No medications on file    Planned Follow Up No follow-ups on file.   Total Time in Face-to-Face and in Coordination of Care for patient including independent/personal interpretation/review of prior testing, medical history, examination, medication adjustment, communicating results with the patient directly, and documentation with the EHR is 45 minutes.   Justice Britain, MD [facility] Advanced Endoscopy Office # [phone]

## 2020-04-17 NOTE — Patient Instructions (Signed)
We have sent the following medications to your pharmacy for you to pick up at your convenience: Suprep   START:  A high fiber diet with plenty of fluids (up to 8 glasses of water daily) is suggested to relieve these symptoms.  Metamucil, 1 tablespoon once or twice daily can be used to keep bowels regular if needed.  OR Fiber Con -1 to 2 tablets daily as tolerated.   May consider Bentyl or Levsin in the future for abdomen cramping.   Your provider has requested that you go to the basement level for lab work before leaving today. Press "B" on the elevator. The lab is located at the first door on the left as you exit the elevator.  Due to recent changes in healthcare laws, you may see the results of your imaging and laboratory studies on MyChart before your provider has had a chance to review them.  We understand that in some cases there may be results that are confusing or concerning to you. Not all laboratory results come back in the same time frame and the provider may be waiting for multiple results in order to interpret others.  Please give [place] 48 hours in order for your provider to thoroughly review all the results before contacting the office for clarification of your results.   You have been scheduled for an endoscopy and colonoscopy. Please follow the written instructions given to you at your visit today. Please pick up your prep supplies at the pharmacy within the next 1-3 days. If you use inhalers (even only as needed), please bring them with you on the day of your procedure.   If you are age 33 or younger, your body mass index should be between 19-25. Your Body mass index is 24.43 kg/m. If this is out of the aformentioned range listed, please consider follow up with your Primary Care Provider.   Thank you for choosing me and [facility].  Dr. Meridee Score

## 2020-04-18 ENCOUNTER — Encounter: Payer: Self-pay | Admitting: Gastroenterology

## 2020-04-18 LAB — IGA: Immunoglobulin A: 118 mg/dL (ref 47–310)

## 2020-04-18 LAB — TISSUE TRANSGLUTAMINASE, IGA: (tTG) Ab, IgA: <1 U/mL

## 2020-04-20 ENCOUNTER — Other Ambulatory Visit: Payer: Self-pay | Admitting: Family Medicine

## 2020-04-20 DIAGNOSIS — R768 Other specified abnormal immunological findings in serum: Secondary | ICD-10-CM

## 2020-04-20 LAB — GI PROFILE, STOOL, PCR

## 2020-04-24 ENCOUNTER — Other Ambulatory Visit: Payer: Self-pay | Admitting: *Deleted

## 2020-04-24 DIAGNOSIS — K580 Irritable bowel syndrome with diarrhea: Secondary | ICD-10-CM | POA: Insufficient documentation

## 2020-04-24 DIAGNOSIS — K529 Noninfective gastroenteritis and colitis, unspecified: Secondary | ICD-10-CM | POA: Insufficient documentation

## 2020-04-24 DIAGNOSIS — R899 Unspecified abnormal finding in specimens from other organs, systems and tissues: Secondary | ICD-10-CM

## 2020-04-24 DIAGNOSIS — K625 Hemorrhage of anus and rectum: Secondary | ICD-10-CM | POA: Insufficient documentation

## 2020-04-24 DIAGNOSIS — R140 Abdominal distension (gaseous): Secondary | ICD-10-CM | POA: Insufficient documentation

## 2020-04-24 DIAGNOSIS — R634 Abnormal weight loss: Secondary | ICD-10-CM | POA: Insufficient documentation

## 2020-04-24 LAB — PANCREATIC ELASTASE, FECAL: Pancreatic Elastase-1, Stool: >500 mcg/g

## 2020-04-24 LAB — FECAL LEUKOCYTES
Micro Number: 11654591
Result: NOT DETECTED
Specimen Quality: ADEQUATE

## 2020-04-27 ENCOUNTER — Other Ambulatory Visit: Payer: Self-pay

## 2020-04-27 ENCOUNTER — Other Ambulatory Visit (INDEPENDENT_AMBULATORY_CARE_PROVIDER_SITE_OTHER): Payer: Managed Care, Other (non HMO)

## 2020-04-27 DIAGNOSIS — R899 Unspecified abnormal finding in specimens from other organs, systems and tissues: Secondary | ICD-10-CM | POA: Diagnosis not present

## 2020-04-27 LAB — CBC
HCT: 45 % (ref 39.0–52.0)
Hemoglobin: 15.4 g/dL (ref 13.0–17.0)
MCHC: 34.2 g/dL (ref 30.0–36.0)
MCV: 92.5 fl (ref 78.0–100.0)
Platelets: 136 10*3/uL — ABNORMAL LOW (ref 150.0–400.0)
RBC: 4.87 Mil/uL (ref 4.22–5.81)
RDW: 13.1 % (ref 11.5–15.5)
WBC: 3.8 10*3/uL — ABNORMAL LOW (ref 4.0–10.5)

## 2020-04-27 NOTE — Progress Notes (Signed)
Please inform patient of the following:  White blood cell count still low but stable.  Can we try to add on a peripheral smear? If not then we will need to have him come back for a blood draw.

## 2020-05-01 ENCOUNTER — Other Ambulatory Visit: Payer: Self-pay | Admitting: *Deleted

## 2020-05-02 ENCOUNTER — Other Ambulatory Visit: Payer: Self-pay | Admitting: Family Medicine

## 2020-05-02 ENCOUNTER — Other Ambulatory Visit: Payer: Self-pay | Admitting: *Deleted

## 2020-05-02 DIAGNOSIS — R899 Unspecified abnormal finding in specimens from other organs, systems and tissues: Secondary | ICD-10-CM

## 2020-05-11 ENCOUNTER — Other Ambulatory Visit: Payer: Managed Care, Other (non HMO)

## 2020-05-14 ENCOUNTER — Other Ambulatory Visit (INDEPENDENT_AMBULATORY_CARE_PROVIDER_SITE_OTHER): Payer: Managed Care, Other (non HMO)

## 2020-05-14 ENCOUNTER — Other Ambulatory Visit: Payer: Self-pay

## 2020-05-14 DIAGNOSIS — R899 Unspecified abnormal finding in specimens from other organs, systems and tissues: Secondary | ICD-10-CM

## 2020-05-15 LAB — PATHOLOGIST SMEAR REVIEW

## 2020-05-15 NOTE — Progress Notes (Signed)
Please inform patient of the following:  His pathologist review was normal. Do not need to do any further testing at this point. Would like for him to follow up with rheumatology as we discussed.  Katina Degree. Jimmey Ralph, MD 05/15/2020 12:54 PM

## 2020-05-21 ENCOUNTER — Other Ambulatory Visit: Payer: Self-pay

## 2020-05-21 ENCOUNTER — Telehealth: Payer: Self-pay

## 2020-05-21 DIAGNOSIS — R768 Other specified abnormal immunological findings in serum: Secondary | ICD-10-CM

## 2020-05-21 NOTE — Telephone Encounter (Signed)
Faxed referral to [facility] with Dr. Dierdre Forth

## 2020-05-21 NOTE — Telephone Encounter (Signed)
Pt would like to be referred to Dr. Dierdre Forth for a rheumatologist.

## 2020-05-21 NOTE — Telephone Encounter (Signed)
Order placed

## 2020-06-12 ENCOUNTER — Other Ambulatory Visit: Payer: Self-pay | Admitting: Gastroenterology

## 2020-06-12 ENCOUNTER — Encounter: Payer: Self-pay | Admitting: Gastroenterology

## 2020-06-12 ENCOUNTER — Other Ambulatory Visit: Payer: Self-pay

## 2020-06-12 ENCOUNTER — Ambulatory Visit (AMBULATORY_SURGERY_CENTER): Payer: Managed Care, Other (non HMO) | Admitting: Gastroenterology

## 2020-06-12 VITALS — BP 115/64 | HR 70 | Temp 98.1°F | Resp 14 | Ht 70.0 in | Wt 170.0 lb

## 2020-06-12 DIAGNOSIS — R194 Change in bowel habit: Secondary | ICD-10-CM

## 2020-06-12 DIAGNOSIS — K529 Noninfective gastroenteritis and colitis, unspecified: Secondary | ICD-10-CM | POA: Diagnosis present

## 2020-06-12 DIAGNOSIS — K2970 Gastritis, unspecified, without bleeding: Secondary | ICD-10-CM

## 2020-06-12 DIAGNOSIS — R634 Abnormal weight loss: Secondary | ICD-10-CM | POA: Diagnosis not present

## 2020-06-12 DIAGNOSIS — K641 Second degree hemorrhoids: Secondary | ICD-10-CM | POA: Diagnosis not present

## 2020-06-12 DIAGNOSIS — K580 Irritable bowel syndrome with diarrhea: Secondary | ICD-10-CM

## 2020-06-12 DIAGNOSIS — K2950 Unspecified chronic gastritis without bleeding: Secondary | ICD-10-CM

## 2020-06-12 MED ORDER — SODIUM CHLORIDE 0.9 % IV SOLN: 500.0000 mL | Freq: Once | INTRAVENOUS | Status: DC

## 2020-06-12 NOTE — Progress Notes (Signed)
VS by CW  Pt's states no medical or surgical changes since previsit or office visit.  

## 2020-06-12 NOTE — Patient Instructions (Signed)
Handouts provided on gastritis, hemorrhoids and high-fiber diet.   Recommend a high-fiber diet.   YOU HAD AN ENDOSCOPIC PROCEDURE TODAY AT THE [facility]:   Refer to the procedure report that was given to you for any specific questions about what was found during the examination.  If the procedure report does not answer your questions, please call your gastroenterologist to clarify.  If you requested that your care partner not be given the details of your procedure findings, then the procedure report has been included in a sealed envelope for you to review at your convenience later.  YOU SHOULD EXPECT: Some feelings of bloating in the abdomen. Passage of more gas than usual.  Walking can help get rid of the air that was put into your GI tract during the procedure and reduce the bloating. If you had a lower endoscopy (such as a colonoscopy or flexible sigmoidoscopy) you may notice spotting of blood in your stool or on the toilet paper. If you underwent a bowel prep for your procedure, you may not have a normal bowel movement for a few days.  Please Note:  You might notice some irritation and congestion in your nose or some drainage.  This is from the oxygen used during your procedure.  There is no need for concern and it should clear up in a day or so.  SYMPTOMS TO REPORT IMMEDIATELY:   Following lower endoscopy (colonoscopy or flexible sigmoidoscopy):  Excessive amounts of blood in the stool  Significant tenderness or worsening of abdominal pains  Swelling of the abdomen that is new, acute  Fever of 100F or higher   Following upper endoscopy (EGD)  Vomiting of blood or coffee ground material  New chest pain or pain under the shoulder blades  Painful or persistently difficult swallowing  New shortness of breath  Fever of 100F or higher  Black, tarry-looking stools  For urgent or emergent issues, a gastroenterologist can be reached at any hour by calling [phone]. Do  not use MyChart messaging for urgent concerns.    DIET:  We do recommend a small meal at first, but then you may proceed to your regular diet.  Drink plenty of fluids but you should avoid alcoholic beverages for 24 hours.  ACTIVITY:  You should plan to take it easy for the rest of today and you should NOT DRIVE or use heavy machinery until tomorrow (because of the sedation medicines used during the test).    FOLLOW UP: Our staff will call the number listed on your records 48-72 hours following your procedure to check on you and address any questions or concerns that you may have regarding the information given to you following your procedure. If we do not reach you, we will leave a message.  We will attempt to reach you two times.  During this call, we will ask if you have developed any symptoms of COVID 19. If you develop any symptoms (ie: fever, flu-like symptoms, shortness of breath, cough etc.) before then, please call [phone].  If you test positive for Covid 19 in the 2 weeks post procedure, please call and report this information to [place].    If any biopsies were taken you will be contacted by phone or by letter within the next 1-3 weeks.  Please call us at [phone] if you have not heard about the biopsies in 3 weeks.    SIGNATURES/CONFIDENTIALITY: You and/or your care partner have signed paperwork which will be entered into your  electronic medical record.  These signatures attest to the fact that that the information above on your After Visit Summary has been reviewed and is understood.  Full responsibility of the confidentiality of this discharge information lies with you and/or your care-partner.

## 2020-06-12 NOTE — Op Note (Signed)
[facility] Patient Name: Randy Mayer Procedure Date: 06/12/2020 2:27 PM MRN: [id number] Endoscopist: Corliss Parish , MD Age: 25 Referring MD:  Date of Birth: [birth year] Gender: Male Account #: [id number] Procedure:                Upper GI endoscopy Indications:              Abdominal bloating, Diarrhea, Weight loss Medicines:                Monitored Anesthesia Care Procedure:                Pre-Anesthesia Assessment:                           - Prior to the procedure, a History and Physical                            was performed, and patient medications and                            allergies were reviewed. The patient's tolerance of                            previous anesthesia was also reviewed. The risks                            and benefits of the procedure and the sedation                            options and risks were discussed with the patient.                            All questions were answered, and informed consent                            was obtained. Prior Anticoagulants: The patient has                            taken no previous anticoagulant or antiplatelet                            agents. ASA Grade Assessment: I - A normal, healthy                            patient. After reviewing the risks and benefits,                            the patient was deemed in satisfactory condition to                            undergo the procedure.                           After obtaining informed consent, the endoscope was  passed under direct vision. Throughout the                            procedure, the patient's blood pressure, pulse, and                            oxygen saturations were monitored continuously. The                            Endoscope was introduced through the mouth, and                            advanced to the second part of duodenum. The upper                            GI endoscopy was  accomplished without difficulty.                            The patient tolerated the procedure. Scope In: Scope Out: Findings:                 No gross lesions were noted in the entire                            esophagus. Biopsies were taken with a cold forceps                            for histology to rule out EoE.                           The Z-line was regular and was found 42 cm from the                            incisors.                           Patchy mildly erythematous mucosa without bleeding                            was found in the entire examined stomach. Biopsies                            were taken with a cold forceps for histology and                            Helicobacter pylori testing.                           No gross lesions were noted in the duodenal bulb,                            in the first portion of the duodenum and in the  second portion of the duodenum. Biopsies were taken                            with a cold forceps for histology to rule out                            Celiac/Enteropathies. Complications:            No immediate complications. Estimated Blood Loss:     Estimated blood loss was minimal. Impression:               - No gross lesions in esophagus. Biopsied.                           - Z-line regular, 42 cm from the incisors.                           - Erythematous mucosa in the stomach. Biopsied.                           - No gross lesions in the duodenal bulb, in the                            first portion of the duodenum and in the second                            portion of the duodenum. Biopsied. Recommendation:           - Proceed to scheduled colonoscopy.                           - Continue present medications.                           - Await pathology results.                           - Consider PPI based on gastric pathology.                           - The findings and recommendations  were discussed                            with the patient.                           - The findings and recommendations were discussed                            with the patient's family. Corliss Parish, MD 06/12/2020 3:05:02 PM

## 2020-06-12 NOTE — Progress Notes (Signed)
pt tolerated well. VSS. awake and to recovery. Report given to RN. Bite block left insiu to recovery. No trauma.

## 2020-06-12 NOTE — Op Note (Signed)
[place] Patient Name: Randy Mayer Procedure Date: 06/12/2020 2:27 PM MRN: [id number] Endoscopist: Justice Britain , MD Age: 25 Referring MD:  Date of Birth: 1995/04/26 Gender: Male Account #: [id number] Procedure:                Colonoscopy Indications:              Chronic diarrhea, Change in bowel habits, Weight                            loss Medicines:                Monitored Anesthesia Care Procedure:                Pre-Anesthesia Assessment:                           - Prior to the procedure, a History and Physical                            was performed, and patient medications and                            allergies were reviewed. The patient's tolerance of                            previous anesthesia was also reviewed. The risks                            and benefits of the procedure and the sedation                            options and risks were discussed with the patient.                            All questions were answered, and informed consent                            was obtained. Prior Anticoagulants: The patient has                            taken no previous anticoagulant or antiplatelet                            agents. ASA Grade Assessment: I - A normal, healthy                            patient. After reviewing the risks and benefits,                            the patient was deemed in satisfactory condition to                            undergo the procedure.  After obtaining informed consent, the colonoscope                            was passed under direct vision. Throughout the                            procedure, the patient's blood pressure, pulse, and                            oxygen saturations were monitored continuously. The                            Olympus CF-HQ190 [id number]) Colonoscope was                            introduced through the anus and advanced to the 10                             cm into the ileum. The colonoscopy was performed                            without difficulty. The patient tolerated the                            procedure. The quality of the bowel preparation was                            good. The terminal ileum, ileocecal valve,                            appendiceal orifice, and rectum were photographed. Scope In: 9:67:89 PM Scope Out: 3:01:09 PM Scope Withdrawal Time: 0 hours 10 minutes 7 seconds  Total Procedure Duration: 0 hours 14 minutes 58 seconds  Findings:                 The digital rectal exam findings include                            hemorrhoids. Pertinent negatives include no                            palpable rectal lesions.                           The terminal ileum and ileocecal valve appeared                            normal. Biopsies were taken with a cold forceps for                            histology.                           Normal mucosa was found in the entire colon.  Biopsies were taken with a cold forceps for                            histology.                           Non-bleeding non-thrombosed internal hemorrhoids                            were found during retroflexion, during perianal                            exam and during digital exam. The hemorrhoids were                            Grade II (internal hemorrhoids that prolapse but                            reduce spontaneously). Complications:            No immediate complications. Estimated Blood Loss:     Estimated blood loss was minimal. Impression:               - Hemorrhoids found on digital rectal exam.                           - The examined portion of the ileum was normal.                            Biopsied.                           - Normal mucosa in the entire examined colon.                            Biopsied.                           - Non-bleeding non-thrombosed internal  hemorrhoids. Recommendation:           - The patient will be observed post-procedure,                            until all discharge criteria are met.                           - Discharge patient to home.                           - Patient has a contact number available for                            emergencies. The signs and symptoms of potential                            delayed complications were discussed with the  patient. Return to normal activities tomorrow.                            Written discharge instructions were provided to the                            patient.                           - High fiber diet.                           - Continue present medications.                           - Await pathology results.                           - Repeat colonoscopy at age 29 for screening                            purposes.                           - If pathology unremarkable then consideration of                            SIBO breath testing.                           - Consideration of Cholestyramine use as attempt at                            treatment can be considered.                           - If issues of cramping occurs query use of                            Levsin/Bentyl in future.                           - The findings and recommendations were discussed                            with the patient.                           - The findings and recommendations were discussed                            with the patient's family. Justice Britain, MD 06/12/2020 3:08:51 PM

## 2020-06-14 ENCOUNTER — Telehealth: Payer: Self-pay | Admitting: *Deleted

## 2020-06-14 ENCOUNTER — Telehealth: Payer: Self-pay

## 2020-06-14 NOTE — Telephone Encounter (Signed)
Left message on answering machine. 

## 2020-06-14 NOTE — Telephone Encounter (Signed)
Second attempt, left VM.  

## 2020-06-20 ENCOUNTER — Encounter: Payer: Self-pay | Admitting: Gastroenterology

## 2020-06-20 LAB — CBC AND DIFFERENTIAL
HCT: 46 (ref 41–53)
Hemoglobin: 15.6 (ref 13.5–17.5)
Neutrophils Absolute: 1.7
Platelets: 144 — AB (ref 150–399)
WBC: 3.2

## 2020-06-20 LAB — CBC: RBC: 5.03 (ref 3.87–5.11)

## 2020-06-22 ENCOUNTER — Other Ambulatory Visit: Payer: Self-pay

## 2020-06-22 MED ORDER — ESOMEPRAZOLE MAGNESIUM 40 MG PO CPDR: 40.0000 mg | DELAYED_RELEASE_CAPSULE | Freq: Every day | ORAL | 6 refills | Status: DC

## 2020-06-28 ENCOUNTER — Encounter: Payer: Self-pay | Admitting: Family Medicine

## 2020-07-11 ENCOUNTER — Telehealth: Payer: Self-pay

## 2020-07-11 DIAGNOSIS — D729 Disorder of white blood cells, unspecified: Secondary | ICD-10-CM

## 2020-07-11 DIAGNOSIS — D691 Qualitative platelet defects: Secondary | ICD-10-CM

## 2020-07-11 NOTE — Telephone Encounter (Signed)
See note

## 2020-07-11 NOTE — Telephone Encounter (Signed)
Patient father states he needs to be seen by hematology he has been seen by rheumatology and several other providers who all state he needs to be seen by hematology can we put in referral for this patients father states dr. Jimmey Ralph should know about all this

## 2020-07-12 ENCOUNTER — Telehealth: Payer: Self-pay | Admitting: Hematology and Oncology

## 2020-07-12 NOTE — Telephone Encounter (Signed)
Scheduled appt per 6/9 referral. Pt aware.  

## 2020-07-12 NOTE — Telephone Encounter (Signed)
Ok to refer for low platelets and low WBC.  Randy Mayer. Jimmey Ralph, MD 07/12/2020 12:46 PM

## 2020-07-12 NOTE — Telephone Encounter (Signed)
Patient was notified.

## 2020-07-12 NOTE — Telephone Encounter (Signed)
Left message on voicemail to call office.Hematology referral has been placed in Epic.

## 2020-07-21 NOTE — Progress Notes (Signed)
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 male is here because of recent diagnosis of low white blood cell count and low platelet counts. Labs on 06/20/20 showed HGB 15.6, HCT 46, PLT 144, WBC 3.2, and RBC 5.03. He presents to the clinic today for initial evaluation and discussion of treatment options.  He has had multiple issues over the past 10 years.  He has had chronic abdominal pain and cramps.  He has had lately unexplained weight loss.  His wife is a Health and safety inspector and he has been eating extremely well in spite of that he has been losing weight.  He lifts weights and overall he feels otherwise pretty good.  He had an endoscopy and colonoscopy and there was no clear abnormalities noted.  He has seen rheumatologist for a positive ANA.  He was informed that he does not have lupus. He was sent to [place] because his white blood cell count has been fluctuating between 3.5 and 3.8.  His platelet counts have been fluctuating between 135 and 144.  He does not have any easy bruising or bleeding issues.   I reviewed her records extensively and collaborated the history with the patient.   MEDICAL HISTORY:  No past medical history on file.  SURGICAL HISTORY: Past Surgical History:  Procedure Laterality Date   WISDOM TOOTH EXTRACTION      SOCIAL HISTORY: Social History   Socioeconomic History   Marital status: Single    Spouse name: Not on file   Number of children: Not on file   Years of education: Not on file   Highest education level: Not on file  Occupational History   Not on file  Tobacco Use   Smoking status: Never   Smokeless tobacco: Never  Vaping Use   Vaping Use: Never used  Substance and Sexual Activity   Alcohol use: Yes    Comment: rare    Drug use: Never   Sexual activity: Not on file  Other Topics Concern   Not on file  Social History Narrative   Not on file   Social Determinants of Health   Financial Resource Strain: Not on file  Food Insecurity: Not on file  Transportation Needs: Not on file  Physical Activity: Not on file  Stress: Not on file  Social Connections: Not on file  Intimate Partner Violence: Not on file    FAMILY HISTORY: Family History  Problem Relation Age of Onset   Arthritis Father    Asthma Father    Heart disease Father    High blood pressure Father    Colon polyps Father    Diabetes Father    Colon cancer Neg Hx    Esophageal cancer Neg Hx    Inflammatory bowel disease Neg Hx    Liver disease Neg Hx    Pancreatic cancer Neg Hx    Rectal cancer Neg Hx    Stomach cancer Neg Hx     ALLERGIES:  has No Known Allergies.  MEDICATIONS:  Current Outpatient Medications  Medication Sig Dispense Refill   esomeprazole (NEXIUM) 40 MG capsule Take 1 capsule (40 mg total) by mouth daily at 12 noon. 30 capsule 6   Current Facility-Administered Medications  Medication Dose Route Frequency Provider Last Rate Last Admin   0.9 %  sodium chloride infusion  500 mL Intravenous Once Mansouraty, Netty Starring., MD        REVIEW OF SYSTEMS:   Constitutional: Denies fevers, chills or abnormal night sweats Eyes: Denies blurriness of vision, double vision or watery eyes Ears, nose, mouth, throat, and face: Denies mucositis or sore throat Respiratory: Denies cough, dyspnea or wheezes Cardiovascular: Denies palpitation, chest discomfort or lower extremity swelling Gastrointestinal: Abdominal cramps and pain and diarrhea Skin: Denies abnormal skin rashes Lymphatics: Denies new lymphadenopathy or easy bruising Neurological:Denies numbness, tingling or new weaknesses Behavioral/Psych: Mood is stable, no new changes  All other systems were reviewed with the patient and are negative.  PHYSICAL  EXAMINATION: ECOG PERFORMANCE STATUS: 1 - Symptomatic but completely ambulatory  Vitals:   07/23/20 1554  BP: 120/62  Pulse: (!) 56  Resp: 18  Temp: (!) 97.5 F (36.4 C)  SpO2: 100%   Filed Weights   07/23/20 1554  Weight: 168 lb 3.2 oz (76.3 kg)    GENERAL:alert, no distress and comfortable SKIN: skin color, texture, turgor are normal, no rashes or significant lesions EYES: normal, conjunctiva are pink and non-injected, sclera clear OROPHARYNX:no exudate, no erythema and lips, buccal mucosa, and tongue normal  NECK: supple, thyroid normal size, non-tender, without nodularity LYMPH:  no palpable lymphadenopathy in the cervical, axillary or inguinal LUNGS: clear to auscultation and percussion with normal breathing effort HEART: regular rate & rhythm and no murmurs and no lower extremity edema ABDOMEN:abdomen soft, non-tender and normal bowel sounds Musculoskeletal:no cyanosis of digits and no clubbing  PSYCH: alert & oriented x 3 with fluent speech NEURO: no focal motor/sensory deficits  LABORATORY DATA:  I have reviewed the data as listed Lab Results  Component Value Date   WBC 3.2 06/20/2020   HGB 15.6 06/20/2020   HCT 46 06/20/2020   MCV 92.5 04/27/2020   PLT 144 (A) 06/20/2020   Lab Results  Component Value Date   NA 140 03/01/2020   K 3.9 03/01/2020   CL 104 03/01/2020   CO2 30 03/01/2020    RADIOGRAPHIC STUDIES: I have personally reviewed the radiological reports and agreed with the findings in the report.  ASSESSMENT AND PLAN:  Leukopenia and thrombocytopenia: Mild 03/01/2020: WBC 3.5 (no differential) 06/20/20 HGB 15.6, HCT 46, PLT 144, WBC 3.2, and RBC 5.03  Work-up identified positive ANA  Differential diagnosis: 1. Infections: No current active infections 2. inflammation/autoimmune: Positive ANA suggest autoimmune cause.  But rheumatology ruled out lupus. 3. Nutritional causes: We will check for B-12 or folic acid deficiency 4. Medication  induced: Patient does not take any medications 5. Bone marrow disorders  Workup today: 1. CBC with differential with smear 2. B-12 and folic acid levels 3.  CT chest, abdomen and pelvis to rule out the cause of unexplained weight loss.  Patient is going on a honeymoon for several weeks and he will be back in July 8.  We will try to obtain the CT scan after he returns back from his vacation. I will call him the day after the scan to discuss results.  All questions were answered. The patient knows to call the clinic with any problems, questions or concerns.   Sabas Sous, MD, MPH 07/23/2020    I, Alda Ponder, am acting as scribe for Serena Croissant, MD.  I have reviewed the above documentation for accuracy and completeness, and I agree with the above.

## 2020-07-23 ENCOUNTER — Inpatient Hospital Stay: Payer: Managed Care, Other (non HMO)

## 2020-07-23 ENCOUNTER — Other Ambulatory Visit: Payer: Self-pay | Admitting: *Deleted

## 2020-07-23 ENCOUNTER — Inpatient Hospital Stay: Payer: Managed Care, Other (non HMO) | Attending: Hematology and Oncology | Admitting: Hematology and Oncology

## 2020-07-23 ENCOUNTER — Other Ambulatory Visit: Payer: Self-pay

## 2020-07-23 VITALS — BP 120/62 | HR 56 | Temp 97.5°F | Resp 18 | Ht 70.0 in | Wt 168.2 lb

## 2020-07-23 DIAGNOSIS — D708 Other neutropenia: Secondary | ICD-10-CM

## 2020-07-23 DIAGNOSIS — D72819 Decreased white blood cell count, unspecified: Secondary | ICD-10-CM | POA: Diagnosis not present

## 2020-07-23 DIAGNOSIS — R109 Unspecified abdominal pain: Secondary | ICD-10-CM

## 2020-07-23 DIAGNOSIS — D696 Thrombocytopenia, unspecified: Secondary | ICD-10-CM

## 2020-07-23 DIAGNOSIS — R634 Abnormal weight loss: Secondary | ICD-10-CM

## 2020-07-23 LAB — CBC WITH DIFFERENTIAL (CANCER CENTER ONLY)
Abs Immature Granulocytes: 0.01 10*3/uL (ref 0.00–0.07)
Basophils Absolute: 0.1 10*3/uL (ref 0.0–0.1)
Basophils Relative: 2 %
Eosinophils Absolute: 0.2 10*3/uL (ref 0.0–0.5)
Eosinophils Relative: 4 %
HCT: 45.4 % (ref 39.0–52.0)
Hemoglobin: 15.3 g/dL (ref 13.0–17.0)
Immature Granulocytes: 0 %
Lymphocytes Relative: 27 %
Lymphs Abs: 1.1 10*3/uL (ref 0.7–4.0)
MCH: 31.1 pg (ref 26.0–34.0)
MCHC: 33.7 g/dL (ref 30.0–36.0)
MCV: 92.3 fL (ref 80.0–100.0)
Monocytes Absolute: 0.5 10*3/uL (ref 0.1–1.0)
Monocytes Relative: 12 %
Neutro Abs: 2.1 10*3/uL (ref 1.7–7.7)
Neutrophils Relative %: 55 %
Platelet Count: 146 10*3/uL — ABNORMAL LOW (ref 150–400)
RBC: 4.92 MIL/uL (ref 4.22–5.81)
RDW: 12 % (ref 11.5–15.5)
WBC Count: 3.9 10*3/uL — ABNORMAL LOW (ref 4.0–10.5)
nRBC: 0 % (ref 0.0–0.2)

## 2020-07-23 LAB — SAVE SMEAR(SSMR), FOR PROVIDER SLIDE REVIEW

## 2020-07-23 LAB — VITAMIN B12: Vitamin B-12: 321 pg/mL (ref 180–914)

## 2020-07-23 LAB — FOLATE: Folate: 16.8 ng/mL (ref >5.9)

## 2020-07-23 NOTE — Assessment & Plan Note (Signed)
03/01/2020: WBC 3.5 (no differential) 06/20/20 HGB 15.6, HCT 46, PLT 144, WBC 3.2, and RBC 5.03  Work-up identified positive ANA  Differential diagnosis: 1. Infections: Especially viruses like HIV 2. inflammation/autoimmune: Positive ANA suggest autoimmune cause. 3. Nutritional causes: We will check for G-90 or folic acid deficiency 4. Medication induced 5. Bone marrow disorders  Workup today: 1. CBC with differential with smear 2. S-11 and folic acid levels  Patient will be watched and monitored.  If the white count or the neutrophil count declined substantially (ANC less than 1) then we will consider doing a bone marrow biopsy.

## 2020-07-24 ENCOUNTER — Ambulatory Visit: Payer: Managed Care, Other (non HMO) | Admitting: Gastroenterology

## 2020-08-23 ENCOUNTER — Ambulatory Visit (HOSPITAL_COMMUNITY): Payer: Managed Care, Other (non HMO)

## 2020-08-24 ENCOUNTER — Other Ambulatory Visit: Payer: Self-pay

## 2020-08-24 ENCOUNTER — Ambulatory Visit
Admission: RE | Admit: 2020-08-24 | Discharge: 2020-08-24 | Disposition: A | Discharge status: Home / Self Care | Payer: Managed Care, Other (non HMO) | Source: Ambulatory Visit | Attending: Hematology and Oncology | Admitting: Hematology and Oncology

## 2020-08-24 DIAGNOSIS — D708 Other neutropenia: Secondary | ICD-10-CM

## 2020-08-24 DIAGNOSIS — R109 Unspecified abdominal pain: Secondary | ICD-10-CM

## 2020-08-24 DIAGNOSIS — R634 Abnormal weight loss: Secondary | ICD-10-CM

## 2020-09-04 ENCOUNTER — Ambulatory Visit: Payer: Managed Care, Other (non HMO) | Admitting: Gastroenterology

## 2020-09-04 ENCOUNTER — Encounter: Payer: Self-pay | Admitting: Gastroenterology

## 2020-09-04 VITALS — BP 118/78 | HR 62 | Ht 70.0 in | Wt 166.0 lb

## 2020-09-04 DIAGNOSIS — R1030 Lower abdominal pain, unspecified: Secondary | ICD-10-CM | POA: Diagnosis not present

## 2020-09-04 DIAGNOSIS — R140 Abdominal distension (gaseous): Secondary | ICD-10-CM

## 2020-09-04 DIAGNOSIS — K529 Noninfective gastroenteritis and colitis, unspecified: Secondary | ICD-10-CM | POA: Diagnosis not present

## 2020-09-04 DIAGNOSIS — F419 Anxiety disorder, unspecified: Secondary | ICD-10-CM

## 2020-09-04 DIAGNOSIS — K580 Irritable bowel syndrome with diarrhea: Secondary | ICD-10-CM | POA: Diagnosis not present

## 2020-09-04 DIAGNOSIS — K2950 Unspecified chronic gastritis without bleeding: Secondary | ICD-10-CM

## 2020-09-04 DIAGNOSIS — F439 Reaction to severe stress, unspecified: Secondary | ICD-10-CM

## 2020-09-04 MED ORDER — AMITRIPTYLINE HCL 50 MG PO TABS: 50.0000 mg | ORAL_TABLET | Freq: Every day | ORAL | 1 refills | Status: DC

## 2020-09-04 NOTE — Patient Instructions (Addendum)
We have sent the following medications to your pharmacy for you to pick up at your convenience: Elavil ( amitriptyline) - Take 1/2 tablet at bedtime for 2 weeks, then may increase to 1 (whole) tablet thereafter.   Send mychart message in 4-6 weeks and let us know how your doing.   If you are age 27 or older, your body mass index should be between 23-30. Your Body mass index is 23.82 kg/m. If this is out of the aforementioned range listed, please consider follow up with your Primary Care Provider.  If you are age 23 or younger, your body mass index should be between 19-25. Your Body mass index is 23.82 kg/m. If this is out of the aformentioned range listed, please consider follow up with your Primary Care Provider.   __________________________________________________________  The [facility] providers would like to encourage you to use [facility] to communicate with providers for non-urgent requests or questions.  Due to long hold times on the telephone, sending your provider a message by [facility] may be a faster and more efficient way to get a response.  Please allow 48 business hours for a response.  Please remember that this is for non-urgent requests.   Follow up in 3 months- office to call with an appointment.   Thank you for choosing me and  Gastroenterology.  Dr. Meridee Score

## 2020-09-04 NOTE — Progress Notes (Signed)
GASTROENTEROLOGY OUTPATIENT CLINIC VISIT   Primary Care Provider Ardith Dark, MD [address] [place] [address] [phone]   Patient Profile: Randy Mayer is a 25 y.o. male without significant PMH other than chronic diarrhea NOS (likely IBS-D) and lower abdominal pain, hemorrhoids.  The patient presents to the [facility] for an evaluation and management of problem(s) noted below:  Problem List 1. Chronic diarrhea   2. Irritable bowel syndrome with diarrhea   3. Bloating   4. Lower abdominal pain   5. Stress      History of Present Illness Please see initial consultation note for full details of HPI.  Interval History The patient returns for a scheduled follow-up.  He is with his wife today.  They have returned from their wedding and honeymoon.  During the time that they were at their honeymoon patient's symptoms were significantly less noticeable.  Now that he is back at work and they are going on with her daily lives, patient has had recurrence of his issues/symptoms.  He has between 1 and 3 loose muddy bowel movements per day.  If he is having multiple loose bowel movements per day he does notice some bleeding on the toilet paper.  He was initiated on PPI therapy as a result of finding of chronic gastritis but negative for H. pylori based on biopsies.  Work-up to date has not shown any evidence of inflammatory bowel disease or colonic obstructions.  Patient was negative for exocrine pancreas insufficiency based on a normal fecal elastase.  Patient still has lower abdominal discomfort that more than 75% of the time will improve after having a bowel movement.  Bloating however persists at times.  Stress and anxiety seem to worsen symptoms at times for the patient.  He has never been on treatment via behavioral therapy or prescriptions for anxiety/stress.  He does think, as does his wife, that some of this could be contributing as a result of his highly  stressful job.  He is interested in further therapies if it is felt that it could benefit him.  GI Review of Systems Positive as above Negative for dysphagia, odynophagia, nausea, vomiting, melena pyrosis, dysphagia, odynophagia, nausea, vomiting, melena  Review of Systems General: Denies fevers/chills Cardiovascular: Denies chest pain/palpitations Pulmonary: Denies shortness of breath Gastroenterological: See HPI Genitourinary: Denies darkened urine Hematological: Denies easy bruising/bleeding Dermatological: Denies jaundice Psychological: Mood is stable though he does deal with significant amount of stressors from a work perspective   Medications Current Outpatient Medications  Medication Sig Dispense Refill   amitriptyline (ELAVIL) 50 MG tablet Take 1 tablet (50 mg total) by mouth at bedtime. 30 tablet 1   Multiple Vitamin (MULTIVITAMIN) tablet Take 1 tablet by mouth daily.     esomeprazole (NEXIUM) 40 MG capsule Take 1 capsule (40 mg total) by mouth daily at 12 noon. 30 capsule 6   No current facility-administered medications for this visit.    Allergies No Known Allergies  Histories History reviewed. No pertinent past medical history. Past Surgical History:  Procedure Laterality Date   WISDOM TOOTH EXTRACTION     Social History   Socioeconomic History   Marital status: Married    Spouse name: Not on file   Number of children: Not on file   Years of education: Not on file   Highest education level: Not on file  Occupational History   Not on file  Tobacco Use   Smoking status: Never   Smokeless tobacco: Never  Vaping  Use   Vaping Use: Never used  Substance and Sexual Activity   Alcohol use: Yes    Comment: rare   Drug use: Never   Sexual activity: Not on file  Other Topics Concern   Not on file  Social History Narrative   Not on file   Social Determinants of Health   Financial Resource Strain: Not on file  Food Insecurity: Not on file   Transportation Needs: Not on file  Physical Activity: Not on file  Stress: Not on file  Social Connections: Not on file  Intimate Partner Violence: Not on file   Family History  Problem Relation Age of Onset   Arthritis Father    Asthma Father    Heart disease Father    High blood pressure Father    Colon polyps Father    Diabetes Father    Colon cancer Neg Hx    Esophageal cancer Neg Hx    Inflammatory bowel disease Neg Hx    Liver disease Neg Hx    Pancreatic cancer Neg Hx    Rectal cancer Neg Hx    Stomach cancer Neg Hx    I have reviewed his medical, social, and family history in detail and updated the electronic medical record as necessary.    PHYSICAL EXAMINATION  BP 118/78   Pulse 62   Ht 5\' 10"  (1.778 m)   Wt 166 lb (75.3 kg)   BMI 23.82 kg/m  Wt Readings from Last 3 Encounters:  09/04/20 166 lb (75.3 kg)  07/23/20 168 lb 3.2 oz (76.3 kg)  06/12/20 170 lb (77.1 kg)  GEN: NAD, appears stated age, doesn't appear chronically ill, accompanied by wife PSYCH: Cooperative, without pressured speech EYE: Conjunctivae pink, sclerae anicteric ENT: Masked CV: Nontachycardic RESP: CTAB posteriorly, without wheezing GI: NABS, soft, NT/ND, without rebound MSK/EXT: No lower extremity edema SKIN: No jaundice NEURO:  Alert & Oriented x 3, no focal deficits   REVIEW OF DATA  I reviewed the following data at the time of this encounter:  GI Procedures and Studies  May 2022 EGD - No gross lesions in esophagus. Biopsied. - Z-line regular, 42 cm from the incisors. - Erythematous mucosa in the stomach. Biopsied. - No gross lesions in the duodenal bulb, in the first portion of the duodenum and in the second portion of the duodenum. Biopsied.  May 2022 colonoscopy - Hemorrhoids found on digital rectal exam. - The examined portion of the ileum was normal. Biopsied. - Normal mucosa in the entire examined colon. Biopsied. - Non-bleeding non-thrombosed internal  hemorrhoids.  Pathology Diagnosis 1. Surgical [P], duodenal - BENIGN SMALL BOWEL MUCOSA. - NO VILLOUS BLUNTING OR INCREASE IN INTRAEPITHELIAL LYMPHOCYTES. - NO DYSPLASIA OR MALIGNANCY. 2. Surgical [P], gastric - MILD CHRONIC GASTRITIS. - WARTHIN-STARRY IS NEGATIVE FOR HELICOBACTER PYLORI. - NO INTESTINAL METAPLASIA, DYSPLASIA, OR MALIGNANCY. 3. Surgical [P], esophageal random - BENIGN SQUAMOUS MUCOSA. - NO INCREASE IN INTRAEPITHELIAL EOSINOPHILS. - NO INTESTINAL METAPLASIA, DYSPLASIA, OR MALIGNANCY. 4. Surgical [P], small bowel, terminal ileum - BENIGN SMALL BOWEL MUCOSA. - NO ACTIVE INFLAMMATION. - NO DYSPLASIA OR MALIGNANCY. 5. Surgical [P], colon nos, random sites - BENIGN COLONIC MUCOSA. - NO ACTIVE INFLAMMATION OR EVIDENCE OF MICROSCOPIC COLITIS. - NO DYSPLASIA OR MALIGNANCY.  Laboratory Studies  Reviewed those in epic  Imaging Studies  No relevant studies to review   ASSESSMENT  Mr. Arora is a 25 y.o. malewithout significant PMH other than chronic diarrhea NOS (likely IBS-D) and lower abdominal pain, hemorrhoids.  The  patient is seen today for evaluation and management of:  1. Chronic diarrhea   2. Irritable bowel syndrome with diarrhea   3. Bloating   4. Lower abdominal pain   5. Stress    The patient is hemodynamically stable.  Clinically grossly remains unchanged.  Based on the patient's evaluation up to this date we have ruled out significantly concerning issues but further work-up is reasonable to consider as well as empiric treatment for likely functional bowel symptoms of IBS-D.  We discussed the possible role of small intestine bacterial overgrowth testing in the future versus empiric treatment with Xifaxan.  We also discussed the potential use of cholestyramine in the future.  In the past when he has used Imodium it just caused him to have significant constipation without improvement in his symptoms of discomfort.  We did discuss potential use of  Levsin/Bentyl though he cannot recall if he has been on either of these but would like to hold off on that currently.  He will let us know if that changes.  I do think that the underlying diagnosis likely is an IBS-D or functional diarrhea.  I think he may get some benefit from TCA initiation and we will initiate him on 25 mg and uptitrate him as able.  He would like to think about further therapies in regards to stress and anxiety and is open to talking with his PCP about things.  There may be some benefit from the initiation of an SSRI or SNRI in the future although we will have to be thoughtful if we continue him on Elavil.  So for now lets see how he does on amitriptyline and see if he makes any benefit and will touch base with his PCP.  He and his wife will continue to "eat healthy and make healthy choices".  We will see him back in follow-up and he will update as we uptitrate his amitriptyline every few weeks (as long as he is doing well).  All patient questions were answered to the best of my ability, and the patient agrees to the aforementioned plan of action with follow-up as indicated.   PLAN  Initiate amitriptyline - 25 mg daily (at bedtime) for 2 weeks then may increase up to 50 mg daily thereafter Patient to MyChart [place] and let us know how you are doing within 4 to 6 weeks and can further uptitrate up to 150 to 200 mg maximum TCA use Consider SIBO breath testing in future Consider empiric Xifaxan use in future Consider Levsin/Bentyl use in the future Will send patient back to PCP for discussion about potential therapies for stress/anxiety (behavioral therapies versus pharmacologic as he is open to consideration of this and thinks that this may help some of his symptoms as well)   No orders of the defined types were placed in this encounter.   New Prescriptions   AMITRIPTYLINE (ELAVIL) 50 MG TABLET    Take 1 tablet (50 mg total) by mouth at bedtime.   Modified Medications   No  medications on file    Planned Follow Up Return in about 3 months (around 12/05/2020).   Total Time in Face-to-Face and in Coordination of Care for patient including independent/personal interpretation/review of prior testing, medical history, examination, medication adjustment, communicating results with the patient directly, and documentation with the EHR is 30 minutes.   Corliss Parish, MD [facility] Office # [phone]

## 2020-09-05 ENCOUNTER — Encounter: Payer: Self-pay | Admitting: Gastroenterology

## 2020-09-05 DIAGNOSIS — F439 Reaction to severe stress, unspecified: Secondary | ICD-10-CM | POA: Insufficient documentation

## 2020-09-05 DIAGNOSIS — K2970 Gastritis, unspecified, without bleeding: Secondary | ICD-10-CM | POA: Insufficient documentation

## 2020-09-05 DIAGNOSIS — R1030 Lower abdominal pain, unspecified: Secondary | ICD-10-CM | POA: Insufficient documentation

## 2020-09-28 ENCOUNTER — Other Ambulatory Visit: Payer: Self-pay | Admitting: Gastroenterology

## 2020-10-07 ENCOUNTER — Other Ambulatory Visit: Payer: Self-pay

## 2020-10-08 MED ORDER — AMITRIPTYLINE HCL 50 MG PO TABS: 50.0000 mg | ORAL_TABLET | Freq: Every day | ORAL | 0 refills | Status: DC

## 2020-10-10 ENCOUNTER — Other Ambulatory Visit: Payer: Self-pay

## 2020-10-10 MED ORDER — AMITRIPTYLINE HCL 75 MG PO TABS: 75.0000 mg | ORAL_TABLET | Freq: Every day | ORAL | 6 refills | Status: DC

## 2020-10-10 NOTE — Telephone Encounter (Signed)
Patient seems to be doing well. We will increase his amitriptyline to 75 mg daily. A new prescription will be sent.  Corliss Parish, MD [facility] Office # [phone]

## 2020-10-12 ENCOUNTER — Encounter: Payer: Self-pay | Admitting: Family Medicine

## 2020-10-12 ENCOUNTER — Ambulatory Visit: Payer: Managed Care, Other (non HMO) | Admitting: Family Medicine

## 2020-10-12 ENCOUNTER — Other Ambulatory Visit: Payer: Self-pay

## 2020-10-12 ENCOUNTER — Encounter: Payer: Self-pay | Admitting: Hematology and Oncology

## 2020-10-12 VITALS — BP 137/77 | HR 86 | Temp 97.6°F | Ht 70.0 in | Wt 170.2 lb

## 2020-10-12 DIAGNOSIS — R768 Other specified abnormal immunological findings in serum: Secondary | ICD-10-CM

## 2020-10-12 DIAGNOSIS — R7989 Other specified abnormal findings of blood chemistry: Secondary | ICD-10-CM | POA: Diagnosis not present

## 2020-10-12 DIAGNOSIS — R634 Abnormal weight loss: Secondary | ICD-10-CM

## 2020-10-12 DIAGNOSIS — K580 Irritable bowel syndrome with diarrhea: Secondary | ICD-10-CM

## 2020-10-12 DIAGNOSIS — R5383 Other fatigue: Secondary | ICD-10-CM | POA: Diagnosis not present

## 2020-10-12 DIAGNOSIS — E038 Other specified hypothyroidism: Secondary | ICD-10-CM | POA: Insufficient documentation

## 2020-10-12 LAB — CBC
HCT: 45.2 % (ref 39.0–52.0)
Hemoglobin: 15.1 g/dL (ref 13.0–17.0)
MCHC: 33.3 g/dL (ref 30.0–36.0)
MCV: 93.3 fl (ref 78.0–100.0)
Platelets: 158 10*3/uL (ref 150.0–400.0)
RBC: 4.85 Mil/uL (ref 4.22–5.81)
RDW: 13.2 % (ref 11.5–15.5)
WBC: 3.1 10*3/uL — ABNORMAL LOW (ref 4.0–10.5)

## 2020-10-12 LAB — COMPREHENSIVE METABOLIC PANEL
ALT: 21 U/L (ref 0–53)
AST: 22 U/L (ref 0–37)
Albumin: 4.3 g/dL (ref 3.5–5.2)
Alkaline Phosphatase: 73 U/L (ref 39–117)
BUN: 25 mg/dL — ABNORMAL HIGH (ref 6–23)
CO2: 30 mEq/L (ref 19–32)
Calcium: 9.3 mg/dL (ref 8.4–10.5)
Chloride: 103 mEq/L (ref 96–112)
Creatinine, Ser: 1.1 mg/dL (ref 0.40–1.50)
GFR: 92.44 mL/min (ref >60.00)
Glucose, Bld: 75 mg/dL (ref 70–99)
Potassium: 3.8 mEq/L (ref 3.5–5.1)
Sodium: 141 mEq/L (ref 135–145)
Total Bilirubin: 0.7 mg/dL (ref 0.2–1.2)
Total Protein: 6.4 g/dL (ref 6.0–8.3)

## 2020-10-12 LAB — VITAMIN B12: Vitamin B-12: 477 pg/mL (ref 211–911)

## 2020-10-12 LAB — T3, FREE: T3, Free: 2.9 pg/mL (ref 2.3–4.2)

## 2020-10-12 LAB — TSH: TSH: 5.77 u[IU]/mL — ABNORMAL HIGH (ref 0.35–5.50)

## 2020-10-12 LAB — TESTOSTERONE: Testosterone: 379.03 ng/dL (ref 300.00–890.00)

## 2020-10-12 LAB — T4, FREE: Free T4: 0.72 ng/dL (ref 0.60–1.60)

## 2020-10-12 NOTE — Assessment & Plan Note (Signed)
Following with GI.  He is doing better on amitriptyline 75 mg nightly.  We will continue current regimen.

## 2020-10-12 NOTE — Patient Instructions (Signed)
It was very nice to see you today!  We will check blood work today.  You can continue your current dose of amitriptyline.  Take care, Dr Jimmey Ralph  PLEASE NOTE:  If you had any lab tests please let us know if you have not heard back within a few days. You may see your results on mychart before we have a chance to review them but we will give you a call once they are reviewed by [place]. If we ordered any referrals today, please let us know if you have not heard from their office within the next week.   Please try these tips to maintain a healthy lifestyle:  Eat at least 3 REAL meals and 1-2 snacks per day.  Aim for no more than 5 hours between eating.  If you eat breakfast, please do so within one hour of getting up.   Each meal should contain half fruits/vegetables, one quarter protein, and one quarter carbs (no bigger than a computer mouse)  Cut down on sweet beverages. This includes juice, soda, and sweet tea.   Drink at least 1 glass of water with each meal and aim for at least 8 glasses per day  Exercise at least 150 minutes every week.

## 2020-10-12 NOTE — Assessment & Plan Note (Signed)
May have subclinical hypothyroidism.  We will recheck TSH, T3, T4, and TPO antibodies today.

## 2020-10-12 NOTE — Assessment & Plan Note (Signed)
Weight has been relatively stable.  Could be related to his IBS.  Also could be related to his subclinical hyperthyroidism.  We will check CBC c-Met and thyroid labs as above.  We will have some improvement with treatment of his IBS.

## 2020-10-12 NOTE — Progress Notes (Signed)
Randy Mayer is a 25 y.o. male who presents today for an office visit.  Assessment/Plan:  New/Acute Problems: Other fatigue We will check B12-this was noted to be borderline low several months ago.  We will check MMA in addition.  May consider B12 replacement if levels are less than 400.  Also check thyroid labs as below and check testosterone.  Chronic Problems Addressed Today: Positive ANA (antinuclear antibody) Has seen rheumatology who did not recommend any further work-up.  Elevated TSH May have subclinical hypothyroidism.  We will recheck TSH, T3, T4, and TPO antibodies today.  Unintentional weight loss Weight has been relatively stable.  Could be related to his IBS.  Also could be related to his subclinical hyperthyroidism.  We will check CBC c-Met and thyroid labs as above.  We will have some improvement with treatment of his IBS.  Irritable bowel syndrome with diarrhea Following with GI.  He is doing better on amitriptyline 75 mg nightly.  We will continue current regimen.  Leukopenia Follows with hematology.  Will need to follow-up with them again soon. Recheck CBC today.     Subjective:  HPI:  Patient here for follow-up visit.  Was seen 8 months ago for concerns for diarrhea and weight loss.  There was some concern for IBS.  Recheck labs at that time which was remarkable for elevated TSH and positive ANA.  Is also found to have a persistently low WBC.  Since her last visit he has followed up with rheumatology who told him that he did not have any underlying autoimmune disorders do not need to follow-up with them any further.  He has seen hematology and they are working up his leukopenia.  He will need to follow-up with them soon.  He has also seen gastroenterology with a presumptive diagnosis of IBS diarrhea predominant.  He was started on amitriptyline which seems to be working well.  Symptoms are overall stable since her last visit.  Still has some issues with fatigue  and inability to gain weight.  He is unsure on FMHx of thyroid issues, but he thinks that his father may have issues regarding thyroid.  Also, he has concerns regarding his testosterone and wants a checkup.          Objective:  Physical Exam: BP 137/77   Pulse 86   Temp 97.6 F (36.4 C) (Temporal)   Ht _0  (1.778 m)   Wt 170 lb 3.2 oz (77.2 kg)   SpO2 98%   BMI 24.42 kg/m   Gen: No acute distress, resting comfortably HEENT: No thyromegaly noted. CV: Regular rate and rhythm with no murmurs appreciated Pulm: Normal work of breathing, clear to auscultation bilaterally with no crackles, wheezes, or rhonchi Neuro: Grossly normal, moves all extremities Psych: Normal affect and thought content      I,Jordan Kelly,acting as a scribe for Dimas Chyle, MD.,have documented all relevant documentation on the behalf of Dimas Chyle, MD,as directed by  Dimas Chyle, MD while in the presence of Dimas Chyle, MD.  I, Dimas Chyle, MD, have reviewed all documentation for this visit. The documentation on 10/12/20 for the exam, diagnosis, procedures, and orders are all accurate and complete.  Time Spent: 45 minutes of total time was spent on the date of the encounter performing the following actions: chart review prior to seeing the patient including recent specialist visits, obtaining history, performing a medically necessary exam, counseling on the treatment plan, placing orders, and documenting in our EHR.  Algis Greenhouse. Jerline Pain, MD 10/12/2020 12:22 PM

## 2020-10-12 NOTE — Assessment & Plan Note (Signed)
Has seen rheumatology who did not recommend any further work-up.

## 2020-10-16 LAB — METHYLMALONIC ACID, SERUM: Methylmalonic Acid, Quant: 122 nmol/L (ref 87–318)

## 2020-10-16 LAB — THYROID PEROXIDASE ANTIBODIES (TPO) (REFL): Thyroperoxidase Ab SerPl-aCnc: 2 IU/mL (ref <9)

## 2020-10-17 NOTE — Progress Notes (Signed)
Please inform patient of the following:  This thyroid is still in the borderline range.  This could explain some of his symptoms.  May be worthwhile to discuss treating his underactive thyroid.  He can schedule a follow-up appointment here to discuss this or we can refer him to endocrinology.  All of his other blood work is normal.

## 2020-10-18 NOTE — Progress Notes (Signed)
Patient Care Team: Ardith Dark, MD as PCP - General (Family Medicine)  DIAGNOSIS:    ICD-10-CM   1. Other neutropenia (HCC)  D70.8       CHIEF COMPLIANT: Follow-up of diagnosed low white cell count and low platelet counts  INTERVAL HISTORY: Randy Mayer is a 25 y.o. with above-mentioned history of low white blood cell count and low platelet counts. Labs on 10/12/2020 showed HGB 15.1, HCT 45.2, PLT 158, WBC 3.1, and RBC 4.85. He presents to the clinic today for for follow-up.   ALLERGIES:  has No Known Allergies.  MEDICATIONS:  Current Outpatient Medications  Medication Sig Dispense Refill   amitriptyline (ELAVIL) 75 MG tablet Take 1 tablet (75 mg total) by mouth at bedtime. 30 tablet 6   esomeprazole (NEXIUM) 40 MG capsule Take 1 capsule (40 mg total) by mouth daily at 12 noon. 30 capsule 6   esomeprazole (NEXIUM) 40 MG capsule esomeprazole magnesium 40 mg capsule,delayed release     Multiple Vitamin (MULTIVITAMIN) tablet Take 1 tablet by mouth daily.     No current facility-administered medications for this visit.    PHYSICAL EXAMINATION: ECOG PERFORMANCE STATUS: 1 - Symptomatic but completely ambulatory  Vitals:   10/19/20 1050  BP: 118/62  Pulse: 80  Resp: 20  Temp: 98.4 F (36.9 C)  SpO2: 100%   Filed Weights   10/19/20 1050  Weight: 168 lb 3.2 oz (76.3 kg)     LABORATORY DATA:  I have reviewed the data as listed CMP Latest Ref Rng & Units 10/12/2020 03/01/2020  Glucose 70 - 99 mg/dL 75 88  BUN 6 - 23 mg/dL 75(T) 70(Y)  Creatinine 0.40 - 1.50 mg/dL 1.74 9.44  Sodium 967 - 145 mEq/L 141 140  Potassium 3.5 - 5.1 mEq/L 3.8 3.9  Chloride 96 - 112 mEq/L 103 104  CO2 19 - 32 mEq/L 30 30  Calcium 8.4 - 10.5 mg/dL 9.3 9.6  Total Protein 6.0 - 8.3 g/dL 6.4 6.7  Total Bilirubin 0.2 - 1.2 mg/dL 0.7 0.8  Alkaline Phos 39 - 117 U/L 73 65  AST 0 - 37 U/L 22 20  ALT 0 - 53 U/L 21 21    Lab Results  Component Value Date   WBC 3.1 (L) 10/12/2020   HGB 15.1  10/12/2020   HCT 45.2 10/12/2020   MCV 93.3 10/12/2020   PLT 158.0 10/12/2020   NEUTROABS 2.1 07/23/2020    ASSESSMENT & PLAN:  Leukopenia 03/01/2020: WBC 3.5 (no differential) 06/20/20 HGB 15.6, HCT 46, PLT 144, WBC 3.2, and RBC 5.03   Work-up identified positive ANA   Lab review: WBC 3.1, hemoglobin 15.1, platelets 158, CMP normal, TSH 5.77, B12 477, methylmalonic acid 122 08/27/2020: CT chest, abdomen and pelvis to rule out the cause of unexplained weight loss: No acute findings in chest abdomen pelvis tiny bilateral renal calculi   No clear-cut etiology identified for the leukopenia.  This could be cyclical or autoimmune in etiology. Recommend watchful monitoring.  I instructed him to come back to see [place] if any of the following happens.  If the ANC drops below 1 or the platelets drop below 100 or if the hemoglobin drops below 10 or if he gets frequent infections are the total white count drops below 2  Informed me that his wife may be high risk for breast cancer and she will discuss with her gynecologist about evaluating her risk and determining if she needs to be seen at the high risk breast  clinic.  He is frustrated because his fatigue continues to be a problem.  His sleep has improved to a certain degree.  He went to see rheumatology but did not get much out of that visit.  He is contemplating on going to [facility] for another opinion on rheumatology.  He works as a Emergency planning/management officer.  No orders of the defined types were placed in this encounter.  The patient has a good understanding of the overall plan. he agrees with it. he will call with any problems that may develop before the next visit here.  Total time spent: 20 mins including face to face time and time spent for planning, charting and coordination of care  Sabas Sous, MD, MPH 10/19/2020  I, Alda Ponder, am acting as scribe for Dr. Serena Croissant.  I have reviewed the above documentation for accuracy and  completeness, and I agree with the above.

## 2020-10-19 ENCOUNTER — Other Ambulatory Visit: Payer: Self-pay

## 2020-10-19 ENCOUNTER — Inpatient Hospital Stay: Payer: Managed Care, Other (non HMO) | Attending: Hematology and Oncology | Admitting: Hematology and Oncology

## 2020-10-19 DIAGNOSIS — R5383 Other fatigue: Secondary | ICD-10-CM | POA: Diagnosis not present

## 2020-10-19 DIAGNOSIS — D708 Other neutropenia: Secondary | ICD-10-CM | POA: Insufficient documentation

## 2020-10-19 NOTE — Assessment & Plan Note (Signed)
03/01/2020: WBC 3.5 (no differential) 06/20/20 HGB 15.6, HCT 46, PLT 144, WBC 3.2, and RBC 5.03  Work-up identified positive ANA  Lab review: WBC 3.1, hemoglobin 15.1, platelets 158, CMP normal, TSH 5.77, B12 477, methylmalonic acid 122 08/27/2020: CT chest, abdomen and pelvis to rule out the cause of unexplained weight loss: No acute findings in chest abdomen pelvis tiny bilateral renal calculi  No clear-cut etiology identified for the leukopenia. Recommend watchful monitoring.

## 2020-11-05 ENCOUNTER — Ambulatory Visit: Payer: Managed Care, Other (non HMO) | Admitting: Family Medicine

## 2020-11-05 ENCOUNTER — Other Ambulatory Visit: Payer: Self-pay

## 2020-11-05 VITALS — BP 127/80 | HR 83 | Temp 98.2°F | Ht 70.0 in | Wt 195.4 lb

## 2020-11-05 DIAGNOSIS — F439 Reaction to severe stress, unspecified: Secondary | ICD-10-CM

## 2020-11-05 DIAGNOSIS — E038 Other specified hypothyroidism: Secondary | ICD-10-CM

## 2020-11-05 DIAGNOSIS — K580 Irritable bowel syndrome with diarrhea: Secondary | ICD-10-CM | POA: Diagnosis not present

## 2020-11-05 MED ORDER — ESOMEPRAZOLE MAGNESIUM 40 MG PO CPDR: 40.0000 mg | DELAYED_RELEASE_CAPSULE | Freq: Every day | ORAL | 6 refills | Status: DC

## 2020-11-05 MED ORDER — LEVOTHYROXINE SODIUM 50 MCG PO TABS: 50.0000 ug | ORAL_TABLET | Freq: Every day | ORAL | 3 refills | Status: AC

## 2020-11-05 NOTE — Assessment & Plan Note (Addendum)
Potentially related to his subclinical hyperthyroidism.  We will be increasing his dose of amitriptyline.  He will start taking 50 mg every morning and continue taking 75 mg nightly.  He will check in with me in a few weeks on mychart.

## 2020-11-05 NOTE — Patient Instructions (Signed)
It was very nice to see you today!  Please start the levothyroxine 50 mcg every morning.  Please take the amitriptyline 50 mg in the morning in addition to 75 mg every.  Please come back in 4 to 6 weeks to recheck your thyroid levels.  Please send me a message in a couple of weeks let me know how you are doing.  Take care, Dr Jimmey Ralph  PLEASE NOTE:  If you had any lab tests please let us know if you have not heard back within a few days. You may see your results on mychart before we have a chance to review them but we will give you a call once they are reviewed by [place]. If we ordered any referrals today, please let us know if you have not heard from their office within the next week.   Please try these tips to maintain a healthy lifestyle:  Eat at least 3 REAL meals and 1-2 snacks per day.  Aim for no more than 5 hours between eating.  If you eat breakfast, please do so within one hour of getting up.   Each meal should contain half fruits/vegetables, one quarter protein, and one quarter carbs (no bigger than a computer mouse)  Cut down on sweet beverages. This includes juice, soda, and sweet tea.   Drink at least 1 glass of water with each meal and aim for at least 8 glasses per day  Exercise at least 150 minutes every week.

## 2020-11-05 NOTE — Assessment & Plan Note (Signed)
Patient with several symptoms including fatigue and GI issues.  We discussed pros and cons of replacement therapy.  We will start Synthroid 50 mcg daily.  He will come back in 6 weeks to recheck TSH.

## 2020-11-05 NOTE — Assessment & Plan Note (Signed)
Follows with GI.  Symptoms are improving but still bothersome.  We will be increasing his dose of amitriptyline as above to 50 in the morning and 75 mg at night.  We will also be treating his subclinical hypothyroidism as above.

## 2020-11-05 NOTE — Progress Notes (Signed)
   Randy Mayer is a 25 y.o. male who presents today for an office visit.  Assessment/Plan:  Chronic Problems Addressed Today: Subclinical hypothyroidism Patient with several symptoms including fatigue and GI issues.  We discussed pros and cons of replacement therapy.  We will start Synthroid 50 mcg daily.  He will come back in 6 weeks to recheck TSH.  Stress Potentially related to his subclinical hyperthyroidism.  We will be increasing his dose of amitriptyline.  He will start taking 50 mg every morning and continue taking 75 mg nightly.  He will check in with me in a few weeks on mychart.  Irritable bowel syndrome with diarrhea Follows with GI.  Symptoms are improving but still bothersome.  We will be increasing his dose of amitriptyline as above to 50 in the morning and 75 mg at night.  We will also be treating his subclinical hypothyroidism as above.     Subjective:  HPI:  Please see A/P for status of chronic conditions.  He is here for lab follow-up today.       Objective:  Physical Exam: BP 127/80   Pulse 83   Temp 98.2 F (36.8 C) (Temporal)   Ht 5\' 10"  (1.778 m)   Wt 195 lb 6.4 oz (88.6 kg)   SpO2 99%   BMI 28.04 kg/m   Gen: No acute distress, resting comfortably CV: Regular rate and rhythm with no murmurs appreciated Pulm: Normal work of breathing, clear to auscultation bilaterally with no crackles, wheezes, or rhonchi Neuro: Grossly normal, moves all extremities Psych: Normal affect and thought content      Rhonin Trott M. , MD 11/05/2020 11:48 AM

## 2020-11-07 ENCOUNTER — Other Ambulatory Visit: Payer: Self-pay | Admitting: Gastroenterology

## 2021-02-25 ENCOUNTER — Ambulatory Visit: Payer: Managed Care, Other (non HMO) | Admitting: Family Medicine

## 2021-02-25 ENCOUNTER — Other Ambulatory Visit: Payer: Self-pay

## 2021-02-25 ENCOUNTER — Encounter: Payer: Self-pay | Admitting: Family Medicine

## 2021-02-25 VITALS — BP 128/72 | HR 98 | Temp 97.4°F | Ht 70.0 in | Wt 173.8 lb

## 2021-02-25 DIAGNOSIS — K580 Irritable bowel syndrome with diarrhea: Secondary | ICD-10-CM | POA: Diagnosis not present

## 2021-02-25 DIAGNOSIS — R5383 Other fatigue: Secondary | ICD-10-CM | POA: Diagnosis not present

## 2021-02-25 DIAGNOSIS — K648 Other hemorrhoids: Secondary | ICD-10-CM | POA: Diagnosis not present

## 2021-02-25 DIAGNOSIS — R7989 Other specified abnormal findings of blood chemistry: Secondary | ICD-10-CM

## 2021-02-25 DIAGNOSIS — E038 Other specified hypothyroidism: Secondary | ICD-10-CM | POA: Diagnosis not present

## 2021-02-25 LAB — CBC
HCT: 46.1 % (ref 39.0–52.0)
Hemoglobin: 15.3 g/dL (ref 13.0–17.0)
MCHC: 33.1 g/dL (ref 30.0–36.0)
MCV: 92.6 fl (ref 78.0–100.0)
Platelets: 134 10*3/uL — ABNORMAL LOW (ref 150.0–400.0)
RBC: 4.98 Mil/uL (ref 4.22–5.81)
RDW: 12.8 % (ref 11.5–15.5)
WBC: 3.2 10*3/uL — ABNORMAL LOW (ref 4.0–10.5)

## 2021-02-25 LAB — COMPREHENSIVE METABOLIC PANEL
ALT: 20 U/L (ref 0–53)
AST: 19 U/L (ref 0–37)
Albumin: 4.5 g/dL (ref 3.5–5.2)
Alkaline Phosphatase: 68 U/L (ref 39–117)
BUN: 22 mg/dL (ref 6–23)
CO2: 30 mEq/L (ref 19–32)
Calcium: 9.6 mg/dL (ref 8.4–10.5)
Chloride: 102 mEq/L (ref 96–112)
Creatinine, Ser: 0.99 mg/dL (ref 0.40–1.50)
GFR: 104.62 mL/min (ref >60.00)
Glucose, Bld: 88 mg/dL (ref 70–99)
Potassium: 4.3 mEq/L (ref 3.5–5.1)
Sodium: 141 mEq/L (ref 135–145)
Total Bilirubin: 0.7 mg/dL (ref 0.2–1.2)
Total Protein: 6.6 g/dL (ref 6.0–8.3)

## 2021-02-25 LAB — TSH: TSH: 1.22 u[IU]/mL (ref 0.35–5.50)

## 2021-02-25 LAB — TESTOSTERONE: Testosterone: 506.43 ng/dL (ref 300.00–890.00)

## 2021-02-25 MED ORDER — HYDROCORTISONE ACETATE 30 MG RE SUPP: 1.0000 | Freq: Every day | RECTAL | 0 refills | Status: DC

## 2021-02-25 MED ORDER — AMITRIPTYLINE HCL 100 MG PO TABS: 100.0000 mg | ORAL_TABLET | Freq: Every day | ORAL | 3 refills | Status: DC

## 2021-02-25 MED ORDER — AMITRIPTYLINE HCL 50 MG PO TABS: 50.0000 mg | ORAL_TABLET | Freq: Every morning | ORAL | Status: DC

## 2021-02-25 NOTE — Patient Instructions (Addendum)
It was very nice to see you today!  Please increase your amitriptyline to 100 mg daily.  I will send in a new prescription.  Please try the Proctocort.  Let me know if this is not improving in the next couple of weeks.  Please try using peppermint oil for your IBS.  You can also work on FODMAP elimination diet.  We may need to adjust the dose of your thyroid medication depending on the results your labs.   Please send me a message in a few weeks let me know how you are doing.  Take care, Dr Jimmey Ralph  PLEASE NOTE:  If you had any lab tests please let us know if you have not heard back within a few days. You may see your results on mychart before we have a chance to review them but we will give you a call once they are reviewed by [place]. If we ordered any referrals today, please let us know if you have not heard from their office within the next week.   Please try these tips to maintain a healthy lifestyle:  Eat at least 3 REAL meals and 1-2 snacks per day.  Aim for no more than 5 hours between eating.  If you eat breakfast, please do so within one hour of getting up.   Each meal should contain half fruits/vegetables, one quarter protein, and one quarter carbs (no bigger than a computer mouse)  Cut down on sweet beverages. This includes juice, soda, and sweet tea.   Drink at least 1 glass of water with each meal and aim for at least 8 glasses per day  Exercise at least 150 minutes every week.

## 2021-02-25 NOTE — Progress Notes (Signed)
° °  Randy Mayer is a 26 y.o. male who presents today for an office visit.  Assessment/Plan:  Chronic Problems Addressed Today: Internal hemorrhoids Likely related to his IBS.  We will be treating IBS as below.  We will start Proctocort for his hemorrhoids.  If this continues to be an issue he will need to follow-up with GI.  Subclinical hypothyroidism Last TSH still above goal.  We will recheck today.  May need to increase to 75 mcg daily.  Irritable bowel syndrome with diarrhea Better on amitriptyline though still not well controlled.  Will increase dose to 100mg  nightly. He will follow-up in a few weeks let me know how this is doing.  Recommended peppermint oil and discussed FODMAP.  He will try both of these     Subjective:  HPI:  See A/P for status of chronic conditions.  Patient is here to follow-up on his IBS as well as subclinical hyperthyroidism.  He is currently on amitriptyline 75 mg at night.  He is doing well.  Still has quite a bit of issue with excessive bloating.  He also has issues with internal hemorrhoids.  This was diagnosed with colonoscopy about 8 months ago.  He has bleeding.  Much every time that he wipes.  He was told that he may need banding but he would like to try topical therapy first.  Overall symptoms seem to be modestly improved compared with his last visit a few months ago.       Objective:  Physical Exam: BP 128/72    Pulse 98    Temp (!) 97.4 F (36.3 C) (Temporal)    Ht 5\' 10"  (1.778 m)    Wt 173 lb 12.8 oz (78.8 kg)    SpO2 97%    BMI 24.94 kg/m   Gen: No acute distress, resting comfortably CV: Regular rate and rhythm with no murmurs appreciated Pulm: Normal work of breathing, clear to auscultation bilaterally with no crackles, wheezes, or rhonchi Neuro: Grossly normal, moves all extremities Psych: Normal affect and thought content      Danyele Smejkal M. , MD 02/25/2021 1:48 PM

## 2021-02-25 NOTE — Assessment & Plan Note (Signed)
Likely related to his IBS.  We will be treating IBS as below.  We will start Proctocort for his hemorrhoids.  If this continues to be an issue he will need to follow-up with GI.

## 2021-02-25 NOTE — Assessment & Plan Note (Addendum)
Better on amitriptyline though still not well controlled.  Will increase dose to 100mg  nightly. He will follow-up in a few weeks let me know how this is doing.  Recommended peppermint oil and discussed FODMAP.  He will try both of these

## 2021-02-25 NOTE — Assessment & Plan Note (Signed)
Last TSH still above goal.  We will recheck today.  May need to increase to 75 mcg daily.

## 2021-02-26 LAB — ABO AND RH

## 2021-02-26 NOTE — Progress Notes (Signed)
Please inform patient of the following:  His blood type is O negative. His other labs are stable. His thyroid is at goal - do not need to make any changes to his treatment plan at this time.   Would like for him to let us know how he is doing with the medication changes we discussed at his office visit yesterday.

## 2021-03-04 ENCOUNTER — Encounter: Payer: Managed Care, Other (non HMO) | Admitting: Family Medicine

## 2021-04-09 ENCOUNTER — Encounter: Payer: Self-pay | Admitting: Gastroenterology

## 2021-04-09 NOTE — Telephone Encounter (Signed)
I responded to the patient on MyChart. ?Plan for SIBO breath testing to be pursued. ?Plan for we will IBS-D Xifaxan treatment 550 mg 3 times daily x14 days. ?Plan for Anusol suppositories nightly x1 week and then every other night until prescription is done (1 refill to be administered). ?If the patient chooses to do the IBS-D Xifaxan treatment, then we cannot do SIBO breath testing for at least 4 weeks. ?Please set up a follow-up in clinic with one of the APP's or myself in the next month. ?Thanks. ?GM ? ?

## 2021-04-15 MED ORDER — RIFAXIMIN 550 MG PO TABS: 550.0000 mg | ORAL_TABLET | Freq: Three times a day (TID) | ORAL | 0 refills | Status: AC

## 2021-04-15 MED ORDER — HYDROCORTISONE ACETATE 30 MG RE SUPP: RECTAL | 1 refills | Status: DC

## 2021-04-15 NOTE — Addendum Note (Signed)
Addended by: Peterson Ao, Laurissa Cowper J on: 04/15/2021 09:00 AM ? ? Modules accepted: Orders ? ?

## 2021-04-16 NOTE — Telephone Encounter (Signed)
Appears pt read My Chart message as indicated below: ? ?Last read by Randy Mayer at  8:18 PM on 04/15/2021. ?

## 2021-05-02 ENCOUNTER — Telehealth: Payer: Self-pay | Admitting: Gastroenterology

## 2021-05-02 NOTE — Telephone Encounter (Signed)
The pt started Xifaxan Tuesday 3/28 and Wednesday over night he began to feel nauseous and vomit.  He was still vomiting as of this morning.  I have advised him to stop the xifaxan and I will send to Dr Meridee Score for further advise. ?

## 2021-05-02 NOTE — Telephone Encounter (Signed)
Inbound call from patient wife stating that patient started taking Xifaxan on 3/28 and started feeling nauseous  3/29 in the middle of the night. Patient has been taking medication 3 times daily, and since then he was been vomiting. Wife is seeking advice if this is a normal side effect and is wanting to know if he should still take the medication. Please advise.   ?

## 2021-05-02 NOTE — Telephone Encounter (Signed)
Left message on machine to call back  

## 2021-05-03 ENCOUNTER — Other Ambulatory Visit: Payer: Self-pay

## 2021-05-03 MED ORDER — ONDANSETRON HCL 4 MG PO TABS: 8.0000 mg | ORAL_TABLET | Freq: Three times a day (TID) | ORAL | 1 refills | Status: AC | PRN

## 2021-05-03 NOTE — Telephone Encounter (Signed)
I have sent the prescription to the pharmacy.  Left message on machine to call back  ?

## 2021-05-03 NOTE — Telephone Encounter (Signed)
Patty, ?Recommend patient be given Zofran 8 mg every 8 hours as needed (30 with 1 refill). ?If nausea is better then he should restart his Xifaxan therapy and completed.  If he cannot tolerate it then he can hold it. ?Needs to be set up with next available APP visit for follow-up otherwise. ?Thanks. ?GM ?

## 2021-05-06 NOTE — Telephone Encounter (Signed)
Left message on machine to call back  

## 2021-05-07 NOTE — Telephone Encounter (Signed)
The pt does view the My chart messages.  ?

## 2021-05-07 NOTE — Telephone Encounter (Signed)
I have been unable to reach the pt by phone.  I will send a My Chart message to have the pt call our office to discuss recommendations.   ?

## 2021-05-14 ENCOUNTER — Ambulatory Visit: Payer: Managed Care, Other (non HMO) | Admitting: Nurse Practitioner

## 2021-06-07 ENCOUNTER — Encounter: Payer: Self-pay | Admitting: Nurse Practitioner

## 2021-06-07 ENCOUNTER — Ambulatory Visit (INDEPENDENT_AMBULATORY_CARE_PROVIDER_SITE_OTHER): Payer: Managed Care, Other (non HMO) | Admitting: Nurse Practitioner

## 2021-06-07 VITALS — HR 72 | Ht 69.75 in | Wt 177.0 lb

## 2021-06-07 DIAGNOSIS — R140 Abdominal distension (gaseous): Secondary | ICD-10-CM | POA: Diagnosis not present

## 2021-06-07 DIAGNOSIS — R109 Unspecified abdominal pain: Secondary | ICD-10-CM | POA: Diagnosis not present

## 2021-06-07 DIAGNOSIS — K625 Hemorrhage of anus and rectum: Secondary | ICD-10-CM | POA: Diagnosis not present

## 2021-06-07 MED ORDER — HYDROCORTISONE (PERIANAL) 2.5 % EX CREA: 1.0000 "application " | TOPICAL_CREAM | Freq: Every evening | CUTANEOUS | 0 refills | Status: AC

## 2021-06-07 NOTE — Patient Instructions (Signed)
You have been given a testing kit to check for small intestine bacterial overgrowth (SIBO) which is completed by a company named [facility]. Make sure to return your test in the mail using the return mailing label given to you along with the kit. Your demographic and insurance information have already been sent to the company and they should be in contact with you over the next 1-2 weeks regarding this test. [facility] will collect an upfront charge of $99.74 for commercial insurance plans and $209.74 is you are paying cash. Make sure to discuss with [facility] PRIOR to having the test to see if they have gotten information from your insurance company as to how much your testing will cost out of pocket, if any. Please keep in mind that you will be getting a call from phone number [phone] or a similar number. If you do not hear from them within this time frame, please call our office at [phone] or call [facility] directly at [phone].   ? ?We have sent the following medications to your pharmacy for you to pick up at your convenience: ?Anusol Cream  ? ?Thank you for choosing me and [facility]. ? ? ? ?

## 2021-06-07 NOTE — Progress Notes (Signed)
? ? ?Assessment  ? ?Patient Profile:  ?Randy Mayer is a 26 y.o. male known to Dr. Rush Landmark with a past medical history of IBS, subclinical hypothyroidism, kidney stones.  Additional medical history as listed in [place] . ? ?Chronic abdominal pain / chronic bloating.  ?CT scan, EGD w/ biopsies / colonoscopy w/ biopsies were unremarkable. TCA not helping, despite recent dosage increase. He is interested in proceeding with SIBO evaluation.  ? ?Hemorrhoid bleeding.  ?He wants to hold off on banding. Steroid cream helped and wants refill ? ?Plan  ? ?Anusol cream PR nightly for 10 days.  ?Proceed with SIBO testing.  ?If workup negative then consider stopping TCA and giving him a trial of an SSRI . Stress / worry clearly exacerbates his symptoms ? ?HPI  ? ?Chief Complaint : abdominal bloating, lower abdominal pain, rectal bleeding ? ?Randy Mayer established care here in March 2022 for evaluation of chronic diarrhea, bloating, lower abdominal pain, rectal bleeding and weight loss.  CTAP chest / abd / pelvis was unremarkable. Subsequent of office visit he had an unrevealing EGD with small bowel biopsies / colonoscopy . Felt to have IBS, started on  Amitriptyline. Symptoms persisted, dose increased by PCP in October. He contacted [place] in early March with increased rectal bleeding, abdominal discomfort. Hemorrhoid cream prescribed. He and Dr. Rush Landmark messaged through [place] about doing a SIBO test vrs treating empirically. Appears decision was made to treat empirically but patient never received antibiotics and has not heard anything about a breath test since . ? ?Interval History:  ? ?Marion is taking Peppermint tabs everyday. He is compliant with Amitriptyline. He isn't any better. He thinks work related stress may be contributing to his symptoms. In addition to work stress his father is not in good health and has just been diagnosed with stage IV lung cancer.  ? ?The abdominal pain nor bloating have improved on  Amitriptyline. He thinks that he has tried Bentyl in the past and it didn't help either. Still having intermittent sharp lower abdominal pain unrelated to eating. Pain sometimes gets better with defecation.  He is constantly bloated and thinks this is what is causing the abdominal pain. He is having 1-2 BMs a day. Rectal bleeding improved after using steroid cream but still has small volume bleeding.     ? ? ?Previous GI Evaluation  ? ?May 2022 EGD and colonoscopy  ?- No gross lesions in esophagus. Biopsied. ?- Z-line regular, 42 cm from the incisors. ?- Erythematous mucosa in the stomach. Biopsied. ?- No gross lesions in the duodenal bulb, in the first portion of the duodenum and in the second ?portion of the duodenum. Biopsied. ? ?- Hemorrhoids found on digital rectal exam. ?- The examined portion of the ileum was normal. Biopsied. ?- Normal mucosa in the entire examined colon. Biopsied. ?- Non-bleeding non-thrombosed internal hemorrhoids. ? ?Diagnosis ?1. Surgical [P], duodenal ?- BENIGN SMALL BOWEL MUCOSA. ?- NO VILLOUS BLUNTING OR INCREASE IN INTRAEPITHELIAL LYMPHOCYTES. ?- NO DYSPLASIA OR MALIGNANCY. ?2. Surgical [P], gastric ?- MILD CHRONIC GASTRITIS. ?- WARTHIN-STARRY IS NEGATIVE FOR HELICOBACTER PYLORI. ?- NO INTESTINAL METAPLASIA, DYSPLASIA, OR MALIGNANCY. ?3. Surgical [P], esophageal random ?- BENIGN SQUAMOUS MUCOSA. ?- NO INCREASE IN INTRAEPITHELIAL EOSINOPHILS. ?- NO INTESTINAL METAPLASIA, DYSPLASIA, OR MALIGNANCY. ?4. Surgical [P], small bowel, terminal ileum ?- BENIGN SMALL BOWEL MUCOSA. ?- NO ACTIVE INFLAMMATION. ?- NO DYSPLASIA OR MALIGNANCY. ?5. Surgical [P], colon nos, random sites ?- BENIGN COLONIC MUCOSA. ?- NO ACTIVE INFLAMMATION OR EVIDENCE OF MICROSCOPIC COLITIS. ?- NO DYSPLASIA  OR MALIGNANCY. ? ? ?Imaging  ? ?July 2022 CT chest / abd / pelvis ?No acute findings within the chest, abdomen, or pelvis. ?  ?Tiny bilateral renal calculi. No evidence of ureteral calculi  or ?hydronephrosis ? ? ?Labs:  ? ?  Latest Ref Rng & Units 02/25/2021  ?  1:51 PM 10/12/2020  ? 12:09 PM 07/23/2020  ?  4:19 PM  ?CBC  ?WBC 4.0 - 10.5 K/uL 3.2   3.1   3.9    ?Hemoglobin 13.0 - 17.0 g/dL 15.3   15.1   15.3    ?Hematocrit 39.0 - 52.0 % 46.1   45.2   45.4    ?Platelets 150.0 - 400.0 K/uL 134.0   158.0   146    ? ? ? ?  Latest Ref Rng & Units 02/25/2021  ?  1:51 PM 10/12/2020  ? 12:09 PM 03/01/2020  ?  9:18 AM  ?Hepatic Function  ?Total Protein 6.0 - 8.3 g/dL 6.6   6.4   6.7    ?Albumin 3.5 - 5.2 g/dL 4.5   4.3   4.4    ?AST 0 - 37 U/L _0 ?ALT 0 - 53 U/L _1 ?Alk Phosphatase 39 - 117 U/L 68   73   65    ?Total Bilirubin 0.2 - 1.2 mg/dL 0.7   0.7   0.8    ? ? ? ?History reviewed. No pertinent past medical history. ? ?Past Surgical History:  ?Procedure Laterality Date  ? WISDOM TOOTH EXTRACTION    ? ? ?Current Medications, Allergies, Family History and Social History were reviewed in [employer] record. ?  ?  ?Current Outpatient Medications  ?Medication Sig Dispense Refill  ? amitriptyline (ELAVIL) 100 MG tablet Take 1 tablet (100 mg total) by mouth at bedtime. 90 tablet 3  ? esomeprazole (NEXIUM) 40 MG capsule Take 1 capsule (40 mg total) by mouth daily at 12 noon. 30 capsule 6  ? HYDROCORTISONE ACE, RECTAL, (PROCTOCORT) 30 MG SUPP Insert rectally every evening x 7 days, then reduce to every other night until gone 28 suppository 1  ? levothyroxine (SYNTHROID) 50 MCG tablet Take 1 tablet (50 mcg total) by mouth daily. 90 tablet 3  ? Multiple Vitamin (MULTIVITAMIN) tablet Take 1 tablet by mouth daily.    ? ondansetron (ZOFRAN) 4 MG tablet Take 2 tablets (8 mg total) by mouth every 8 (eight) hours as needed for nausea or vomiting. 30 tablet 1  ? ?No current facility-administered medications for this visit.  ? ? ?Review of Systems: ?No chest pain. No shortness of breath. No urinary complaints.  ? ? ?Physical Exam ? ?Wt Readings from Last 3 Encounters:  ?06/07/21 177  lb (80.3 kg)  ?02/25/21 173 lb 12.8 oz (78.8 kg)  ?11/05/20 195 lb 6.4 oz (88.6 kg)  ? ? ?BP 118/70   Pulse 72   Ht 5' 9.75" (1.772 m) Comment: height measured without shoes  Wt 177 lb (80.3 kg)   BMI 25.58 kg/m?  ?Constitutional:  Generally well appearing male in no acute distress. ?Psychiatric: Pleasant. Normal mood and affect. Behavior is normal. ?EENT: Pupils normal.  Conjunctivae are normal. No scleral icterus. ?Neck supple.  ?Cardiovascular: Normal rate, regular rhythm. No edema ?Pulmonary/chest: Effort normal and breath sounds normal. No wheezing, rales or rhonchi. ?Abdominal: Soft, nondistended, nontender. Bowel sounds active throughout. There are no masses palpable. No hepatomegaly. ?Neurological:  Alert and oriented to person place and time. ?Skin: Skin is warm and dry. No rashes noted. ? ?Tye Savoy, NP  06/07/2021, 2:30 PM ? ? ? ? ? ? ? ? ?

## 2021-06-09 NOTE — Progress Notes (Signed)
Attending Physician's Attestation   I have reviewed the chart.   I agree with the Advanced Practitioner's note, impression, and recommendations with any updates as below.    Daneya Hartgrove Mansouraty, MD [facility] Advanced Endoscopy Office # [phone]  

## 2021-08-29 ENCOUNTER — Other Ambulatory Visit: Payer: Self-pay | Admitting: Family Medicine

## 2021-09-27 ENCOUNTER — Encounter: Payer: Self-pay | Admitting: Family Medicine

## 2021-09-27 ENCOUNTER — Ambulatory Visit: Payer: Managed Care, Other (non HMO) | Admitting: Family Medicine

## 2021-09-27 VITALS — BP 126/80 | HR 88 | Temp 97.6°F | Ht 69.0 in | Wt 179.0 lb

## 2021-09-27 DIAGNOSIS — E038 Other specified hypothyroidism: Secondary | ICD-10-CM | POA: Diagnosis not present

## 2021-09-27 DIAGNOSIS — L301 Dyshidrosis [pompholyx]: Secondary | ICD-10-CM | POA: Insufficient documentation

## 2021-09-27 MED ORDER — BETAMETHASONE DIPROPIONATE AUG 0.05 % EX OINT: TOPICAL_OINTMENT | Freq: Two times a day (BID) | CUTANEOUS | 0 refills | Status: DC

## 2021-09-27 MED ORDER — AMITRIPTYLINE HCL 150 MG PO TABS: 150.0000 mg | ORAL_TABLET | Freq: Every day | ORAL | 3 refills | Status: DC

## 2021-09-27 NOTE — Assessment & Plan Note (Signed)
Not controlled.  Has tried triamcinolone in the past without much improvement.  We will try topical betamethasone.  We discussed reasons return to care.

## 2021-09-27 NOTE — Progress Notes (Signed)
   Randy Mayer is a 26 y.o. male who presents today for an office visit.  Assessment/Plan:  Chronic Problems Addressed Today: Subclinical hypothyroidism Last TSH at goal though he had stopped taking Synthroid for a few weeks recently.  He has been back on for about a week.  We will continue current dose Synthroid 50 mcg daily.  It is too early to recheck TSH.  They are interested in referral to endocrinology we will place this referral today.  Dyshidrosis Not controlled.  Has tried triamcinolone in the past without much improvement.  We will try topical betamethasone.  We discussed reasons return to care.     Subjective:  HPI:  See A/p for status of chronic conditions        Objective:  Physical Exam: BP 126/80   Pulse 88   Temp 97.6 F (36.4 C) (Temporal)   Ht 5\' 9"  (1.753 m)   Wt 179 lb (81.2 kg)   SpO2 100%   BMI 26.43 kg/m   Gen: No acute distress, resting comfortably CV: Regular rate and rhythm with no murmurs appreciated Pulm: Normal work of breathing, clear to auscultation bilaterally with no crackles, wheezes, or rhonchi Neuro: Grossly normal, moves all extremities Psych: Normal affect and thought content      Randy Mayer M. , MD 09/27/2021 1:33 PM

## 2021-09-27 NOTE — Assessment & Plan Note (Signed)
Last TSH at goal though he had stopped taking Synthroid for a few weeks recently.  He has been back on for about a week.  We will continue current dose Synthroid 50 mcg daily.  It is too early to recheck TSH.  They are interested in referral to endocrinology we will place this referral today.

## 2021-09-27 NOTE — Patient Instructions (Signed)
It was very nice to see you today!  Please try the betamethasone.  I will increase your amitriptyline to 150 mg nightly.  Send a message in a few weeks how this is working for you  We will refer you to see Dr. Talmage Nap as well.  Take care, Dr Jimmey Ralph  PLEASE NOTE:  If you had any lab tests please let us know if you have not heard back within a few days. You may see your results on mychart before we have a chance to review them but we will give you a call once they are reviewed by [place]. If we ordered any referrals today, please let us know if you have not heard from their office within the next week.   Please try these tips to maintain a healthy lifestyle:  Eat at least 3 REAL meals and 1-2 snacks per day.  Aim for no more than 5 hours between eating.  If you eat breakfast, please do so within one hour of getting up.   Each meal should contain half fruits/vegetables, one quarter protein, and one quarter carbs (no bigger than a computer mouse)  Cut down on sweet beverages. This includes juice, soda, and sweet tea.   Drink at least 1 glass of water with each meal and aim for at least 8 glasses per day  Exercise at least 150 minutes every week.

## 2021-11-07 ENCOUNTER — Other Ambulatory Visit: Payer: Self-pay | Admitting: Family Medicine

## 2021-11-08 LAB — VITAMIN B12: Vitamin B-12: 506

## 2021-11-08 LAB — TSH: TSH: 6.46 — AB (ref 0.41–5.90)

## 2021-12-09 ENCOUNTER — Encounter: Payer: Self-pay | Admitting: Family Medicine

## 2022-01-17 ENCOUNTER — Encounter: Payer: Self-pay | Admitting: Family Medicine

## 2022-01-21 NOTE — Telephone Encounter (Signed)
I appreciate the update. I am fine with him coming off of it if he wishes. We had originally prescribed to help with sleep as well as some of his digestive issues.  Recommend that we wean off gradually over the next few weeks instead of him stopping cold [place] to help prevent discontinuation symptoms.  Recommend he go to half a tablet daily for 2 weeks then half a tablet every other day for 2 weeks then stop.  He should let us know if he runs into any issues as we are stopping the medication.

## 2022-02-12 NOTE — Telephone Encounter (Signed)
Pt scheduled for hem banding appt with Dr. Bryan Lemma on 03/21/22 at 2:20 pm. Pt verbalized understanding and had no other concerns at end of call.

## 2022-02-26 ENCOUNTER — Encounter: Payer: Self-pay | Admitting: Gastroenterology

## 2022-02-26 ENCOUNTER — Ambulatory Visit: Payer: Managed Care, Other (non HMO) | Admitting: Gastroenterology

## 2022-02-26 VITALS — BP 110/80 | HR 91 | Ht 70.0 in | Wt 180.0 lb

## 2022-02-26 DIAGNOSIS — R140 Abdominal distension (gaseous): Secondary | ICD-10-CM | POA: Diagnosis not present

## 2022-02-26 DIAGNOSIS — K581 Irritable bowel syndrome with constipation: Secondary | ICD-10-CM

## 2022-02-26 DIAGNOSIS — R1030 Lower abdominal pain, unspecified: Secondary | ICD-10-CM | POA: Diagnosis not present

## 2022-02-26 DIAGNOSIS — K625 Hemorrhage of anus and rectum: Secondary | ICD-10-CM | POA: Diagnosis not present

## 2022-02-26 NOTE — Patient Instructions (Signed)
If you are age 29 or older, your body mass index should be between 23-30. Your Body mass index is 25.83 kg/m. If this is out of the aforementioned range listed, please consider follow up with your Primary Care Provider.  If you are age 63 or younger, your body mass index should be between 19-25. Your Body mass index is 25.83 kg/m. If this is out of the aformentioned range listed, please consider follow up with your Primary Care Provider.   ________________________________________________________  Please complete the SIBO test you were given the last time you were here.  You have been scheduled for a hemorrhoid banding appointment with Dr. Havery Moros on 03-03-22, Monday at 3:40 pm. Please arrive 10 minutes early for registration.    Thank you for entrusting me with your care and for choosing [employer], Alonza Bogus, P.A. - C.

## 2022-02-26 NOTE — Progress Notes (Signed)
Attending Physician's Attestation   I have reviewed the chart.   I agree with the Advanced Practitioner's note, impression, and recommendations with any updates as below. I am sorry to hear that he is having such significant amount of rectal discomfort.  Appreciate Dr. Havery Moros being available for upcoming evaluation to see if hemorrhoidal banding is appropriate.  I agree that since amitriptyline no longer seems to have effectiveness, then I think the use of buspirone or duloxetine is reasonable but I would go ahead and complete the SIBO breath test as well.   Justice Britain, MD [facility] Advanced Endoscopy Office # [phone]

## 2022-02-26 NOTE — Progress Notes (Signed)
02/26/2022 Randy Mayer [id number] 1995/09/24   HISTORY OF PRESENT ILLNESS: This is a 27 year old male who is a patient of Dr. Donneta Romberg.  He established care here in March 2022 for evaluation of chronic lower abdominal pain with bloating and rectal bleeding.  CT of the chest abdomen and pelvis in July 22 was unremarkable except for tiny bilateral renal calculi. Had EGD and colonoscopy as below.  He continues to complain of lower abdominal pain and bloating.  He was on amitriptyline 150 mg daily, but did not have any improvement in his symptoms on that.  He is now tapering off of that and will be stopping it completely soon.  Still complaining of rectal bleeding, which is controlling his daily life as to when he can have bowel movements because the bleeding we will continue for like an hour after a stool.  He was scheduled for banding with Dr. Bryan Lemma in February, but that appointment was canceled yesterday.  He would like to have that done soon as possible.  Reportedly Bentyl did not help his symptoms in the past either.   May 2022 EGD and colonoscopy  - No gross lesions in esophagus. Biopsied. - Z-line regular, 42 cm from the incisors. - Erythematous mucosa in the stomach. Biopsied. - No gross lesions in the duodenal bulb, in the first portion of the duodenum and in the second portion of the duodenum. Biopsied.   - Hemorrhoids found on digital rectal exam. - The examined portion of the ileum was normal. Biopsied. - Normal mucosa in the entire examined colon. Biopsied. - Non-bleeding non-thrombosed internal hemorrhoids.   Diagnosis 1. Surgical [P], duodenal - BENIGN SMALL BOWEL MUCOSA. - NO VILLOUS BLUNTING OR INCREASE IN INTRAEPITHELIAL LYMPHOCYTES. - NO DYSPLASIA OR MALIGNANCY. 2. Surgical [P], gastric - MILD CHRONIC GASTRITIS. - WARTHIN-STARRY IS NEGATIVE FOR HELICOBACTER PYLORI. - NO INTESTINAL METAPLASIA, DYSPLASIA, OR MALIGNANCY. 3. Surgical [P], esophageal  random - BENIGN SQUAMOUS MUCOSA. - NO INCREASE IN INTRAEPITHELIAL EOSINOPHILS. - NO INTESTINAL METAPLASIA, DYSPLASIA, OR MALIGNANCY. 4. Surgical [P], small bowel, terminal ileum - BENIGN SMALL BOWEL MUCOSA. - NO ACTIVE INFLAMMATION. - NO DYSPLASIA OR MALIGNANCY. 5. Surgical [P], colon nos, random sites - BENIGN COLONIC MUCOSA. - NO ACTIVE INFLAMMATION OR EVIDENCE OF MICROSCOPIC COLITIS. - NO DYSPLASIA OR MALIGNANCY.   No past medical history on file. Past Surgical History:  Procedure Laterality Date   WISDOM TOOTH EXTRACTION      reports that he has never smoked. He has never used smokeless tobacco. He reports current alcohol use. He reports that he does not use drugs. family history includes Arthritis in his father; Asthma in his father; Colon polyps in his father; Diabetes in his father; Heart disease in his father; High blood pressure in his father. No Known Allergies    Outpatient Encounter Medications as of 02/26/2022  Medication Sig   amitriptyline (ELAVIL) 150 MG tablet Take 1 tablet (150 mg total) by mouth at bedtime.   augmented betamethasone dipropionate (DIPROLENE-AF) 0.05 % ointment APPLY TOPICALLY TWICE A DAY   esomeprazole (NEXIUM) 40 MG capsule TAKE 1 CAPSULE (40 MG TOTAL) BY MOUTH DAILY AT 12 NOON.   levothyroxine (SYNTHROID) 50 MCG tablet Take 1 tablet (50 mcg total) by mouth daily.   Multiple Vitamin (MULTIVITAMIN) tablet Take 1 tablet by mouth daily.   ondansetron (ZOFRAN) 4 MG tablet Take 2 tablets (8 mg total) by mouth every 8 (eight) hours as needed for nausea or vomiting.   No facility-administered encounter medications on file  as of 02/26/2022.     REVIEW OF SYSTEMS  : All other systems reviewed and negative except where noted in the History of Present Illness.   PHYSICAL EXAM: BP 110/80   Pulse 91   Ht 5\' 10"  (1.778 m)   Wt 180 lb (81.6 kg)   BMI 25.83 kg/m  General: Well developed white male in no acute distress Head: Normocephalic and  atraumatic Eyes:   Sclerae anicteric, conjunctiva pink. Ears: Normal auditory acuity Lungs: Clear throughout to auscultation; no W/R/R. Heart: Regular rate and rhythm; no M/R/G. Abdomen: Soft, non-distended.  BS present.  Non-tender. Musculoskeletal: Symmetrical with no gross deformities  Skin: No lesions on visible extremities Extremities: No edema  Neurological: Alert oriented x 4, grossly non-focal Psychological:  Alert and cooperative. Normal mood and affect  ASSESSMENT AND PLAN: *Rectal bleeding: Colonoscopy showed only hemorrhoids.  Was scheduled for hemorrhoid banding with Dr. Bryan Lemma, but that was canceled yesterday.  He would like to be rescheduled for soon as possible as this is severely impacting his daily life.  Scheduled with Dr. Havery Moros for next week. *Abdominal pain with bloating suspect diagnosis of IBS-C: He was given a SIBO breath test previously, which he has not yet performed.  I encouraged him to do so.  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  Amitriptyline 150 mg at bedtime has not helped his symptoms.  He has been tapering off of that and will be able to discontinue it soon.  I think that either buspirone or duloxetine may be a good option for him to try next pending results of the SIBO breath test.   CC:  Vivi Barrack, MD

## 2022-03-03 ENCOUNTER — Ambulatory Visit: Payer: Managed Care, Other (non HMO) | Admitting: Gastroenterology

## 2022-03-03 ENCOUNTER — Telehealth: Payer: Self-pay

## 2022-03-03 ENCOUNTER — Encounter: Payer: Self-pay | Admitting: Gastroenterology

## 2022-03-03 VITALS — BP 150/80 | HR 115 | Ht 70.0 in | Wt 177.8 lb

## 2022-03-03 DIAGNOSIS — K6289 Other specified diseases of anus and rectum: Secondary | ICD-10-CM

## 2022-03-03 DIAGNOSIS — K642 Third degree hemorrhoids: Secondary | ICD-10-CM | POA: Diagnosis not present

## 2022-03-03 MED ORDER — OXYCODONE-ACETAMINOPHEN 10-325 MG PO TABS: 1.0000 | ORAL_TABLET | Freq: Four times a day (QID) | ORAL | 0 refills | Status: DC | PRN

## 2022-03-03 NOTE — Progress Notes (Addendum)
27 year old male referred here for hemorrhoid banding.  He has had ongoing symptomatic hemorrhoids for a few years now.  Colonoscopy with Dr. Rush Landmark in May 2022 showed grade 2 hemorrhoids.  He endorses bleeding with roughly 80-90% of his stools, can be significant at times.  He has significant irritation and what he describes now as grade 3 prolapse, he states prolapse getting worse over time.  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  We discussed options for treatment of hemorrhoids to include banding, surgery, medical therapy.  He wishes to proceed with banding today after discussion.  He does take a fiber supplement daily but states it can be related to significant gas / bloating at times. He does endorse hard stools at times and straining.   Colonoscopy 06/12/2020 - Dr. Rush Landmark - grade II hemorrhoids  PROCEDURE NOTE: The patient presents with symptomatic grade III  hemorrhoids, requesting rubber band ligation of his/her hemorrhoidal disease.  All risks, benefits and alternative forms of therapy were described and informed consent was obtained.  In the Left Lateral Decubitus position anoscopic examination revealed grade III hemorrhoids in all position(s), external hemorrhoid in RP area. The anorectum was pre-medicated with 0.125% nitroglycerin The decision was made to band the LL internal hemorrhoid, and the Inger was used to perform band ligation without complication.  Digital anorectal examination was then performed to assure proper positioning of the band, and to adjust the banded tissue as required.  The patient was discharged home without pain or other issues.  Dietary and behavioral recommendations were given and along with follow-up instructions.     The following adjunctive treatments were recommended: He will change formulation of fiber to Citrucel once daily to keep stools soft and prevent  straining. Minimize time spent on toilet.  The patient will return in 2-4 weeks for  follow-up and possible additional banding as required. No complications were encountered and the patient tolerated the procedure well.  Jolly Mango, MD [facility]   ADDENDUM: Patient called about 45-60 minutes post procedure. Developed severe rectal discomfort post banding after he got home from the procedure. I asked him to come back to the office after hours. DRE performed, band was in good place, suspect maybe trapped some muscle under the band, I loosened it as much as the patient could tolerate, he was in significant discomfort. Applied topical nitroglycerin and lidocaine ointment. Following manipulation he felt the pain improve and just had pressure / fullness. I asked if he wanted me to remove the band but he given he felt better with loosening and the pain with manipulation to loosen it, he did not want to attempt to remove it. I prescribed him some topical nitroglycerin to use at the [facility] every 6-8 hours, and also gave him samples of lidocaine PR to use for recurrent discomfort. Also ordered a few pills of percocet to take for severe pain should it recur overnight however could not get my [employer] to work after hours to approve the medication. I called him to let him know this and he states he is "90%" better and would not take it anyway, so he will hold off for now. Hopefully he continues to improve with more time. If not and worsens he can call after hours line but only option overnight would be the ED. He seemed much more comfortable following manipulation and wanted to go home.

## 2022-03-03 NOTE — Addendum Note (Signed)
Addended by: Yetta Flock on: 03/03/2022 06:14 PM   Modules accepted: Orders

## 2022-03-03 NOTE — Telephone Encounter (Signed)
Verbal order to [facility] for nitroglycerin 0.125% ointment - apply pea sized amount rectally as needed.

## 2022-03-03 NOTE — Patient Instructions (Addendum)
If you are age 29 or older, your body mass index should be between 23-30. Your Body mass index is 25.51 kg/m. If this is out of the aforementioned range listed, please consider follow up with your Primary Care Provider.  If you are age 12 or younger, your body mass index should be between 19-25. Your Body mass index is 25.51 kg/m. If this is out of the aformentioned range listed, please consider follow up with your Primary Care Provider.   ________________________________________________________   Please purchase the following medications over the counter and take as directed:  Citrucel - use once daily    HEMORRHOID BANDING PROCEDURE    FOLLOW-UP CARE   The procedure you have had should have been relatively painless since the banding of the area involved does not have nerve endings and there is no pain sensation.  The rubber band cuts off the blood supply to the hemorrhoid and the band may fall off as soon as 48 hours after the banding (the band may occasionally be seen in the toilet bowl following a bowel movement). You may notice a temporary feeling of fullness in the rectum which should respond adequately to plain Tylenol or Motrin.  Following the banding, avoid strenuous exercise that evening and resume full activity the next day.  A sitz bath (soaking in a warm tub) or bidet is soothing, and can be useful for cleansing the area after bowel movements.     To avoid constipation, take two tablespoons of natural wheat bran, natural oat bran, flax, Benefiber or any over the counter fiber supplement and increase your water intake to 7-8 glasses daily.    Unless you have been prescribed anorectal medication, do not put anything inside your rectum for two weeks: No suppositories, enemas, fingers, etc.  Occasionally, you may have more bleeding than usual after the banding procedure.  This is often from the untreated hemorrhoids rather than the treated one.  Don't be concerned if there is  a tablespoon or so of blood.  If there is more blood than this, lie flat with your bottom higher than your head and apply an ice pack to the area. If the bleeding does not stop within a half an hour or if you feel faint, call our office at [phone] or go to the emergency room.  Problems are not common; however, if there is a substantial amount of bleeding, severe pain, chills, fever or difficulty passing urine (very rare) or other problems, you should call us at [phone] or report to the nearest emergency room.  Do not stay seated continuously for more than 2-3 hours for a day or two after the procedure.  Tighten your buttock muscles 10-15 times every two hours and take 10-15 deep breaths every 1-2 hours.  Do not spend more than a few minutes on the toilet if you cannot empty your bowel; instead re-visit the toilet at a later time.   You have been scheduled for your 2nd hemorrhoid banding with Dr. Havery Moros on Monday, 03-31-22 at 4:00 pm. Please arrive 10 minutes early for registration.   Thank you for entrusting me with your care and for choosing [facility], Dr. [place] Cellar

## 2022-03-09 ENCOUNTER — Other Ambulatory Visit: Payer: Self-pay | Admitting: Family Medicine

## 2022-03-21 ENCOUNTER — Encounter: Payer: Managed Care, Other (non HMO) | Admitting: Gastroenterology

## 2022-03-23 IMAGING — CT CT CHEST-ABD-PELV W/O CM
1 of 2 series · 12 of 32 positions shown, 18 images · non-contrast
Comparison: None.

CLINICAL DATA: Unexplained weight loss of 15 lb in past 10 months.
Diarrhea. Nausea.

EXAM:
CT CHEST, ABDOMEN AND PELVIS WITHOUT CONTRAST
TECHNIQUE: Multidetector CT imaging of the chest, abdomen and pelvis was
performed following the standard protocol without IV contrast.

[Series 2: chest/abd/pelvis w/(date) · axial · 0.78mm/px · z∈[-623,-58]mm · 12 of 135 slices shown, 18 images]
[im 11/135  soft-tissue]
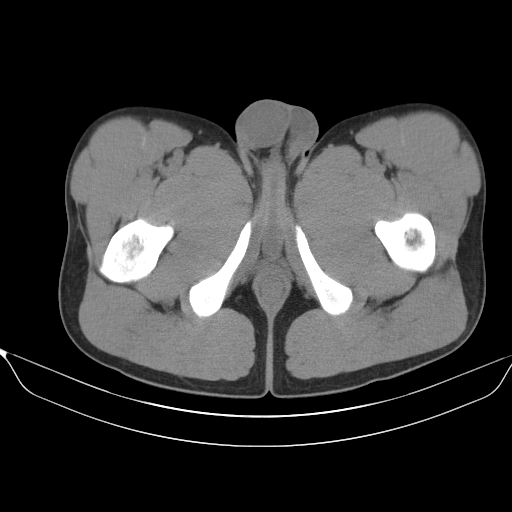
[im 11/135  bone]
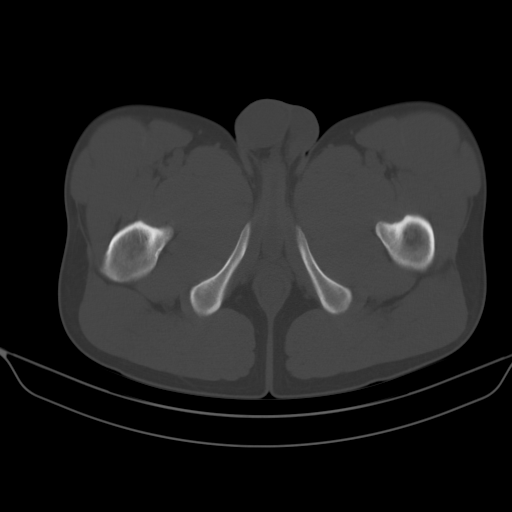
[im 21/135  soft-tissue]
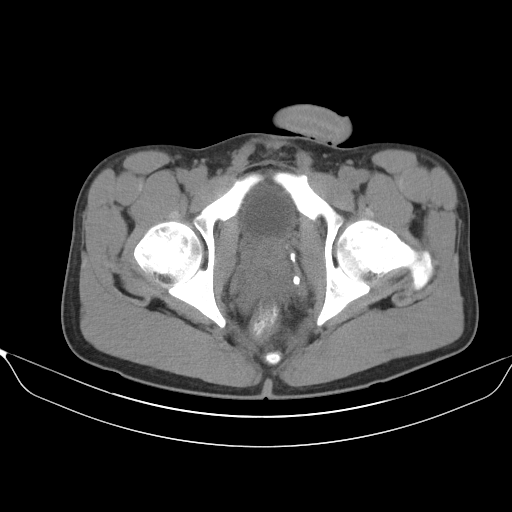
[im 31/135  soft-tissue]
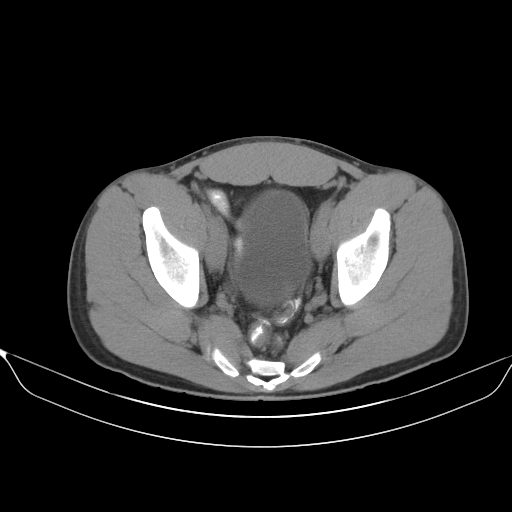
[im 42/135  soft-tissue]
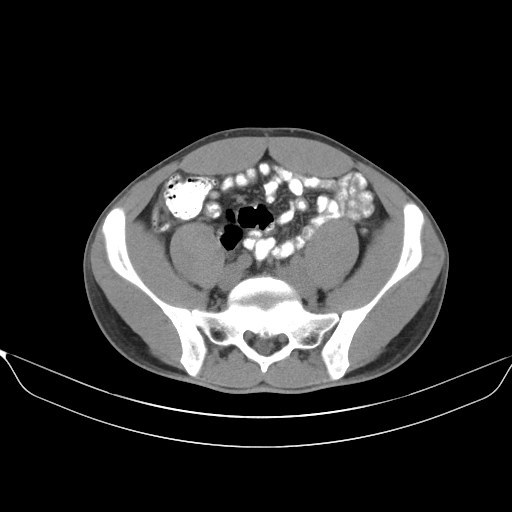
[im 52/135  soft-tissue]
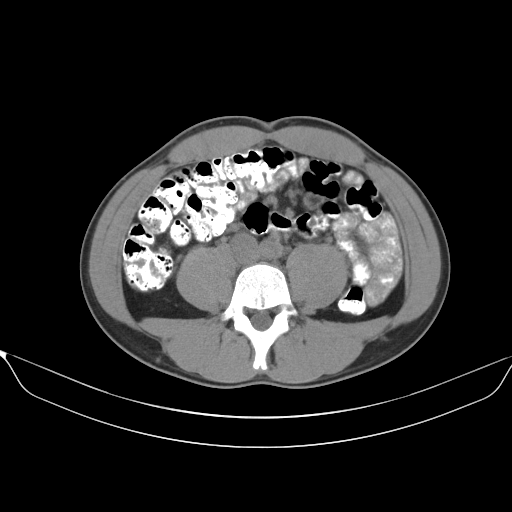
[im 62/135  soft-tissue]
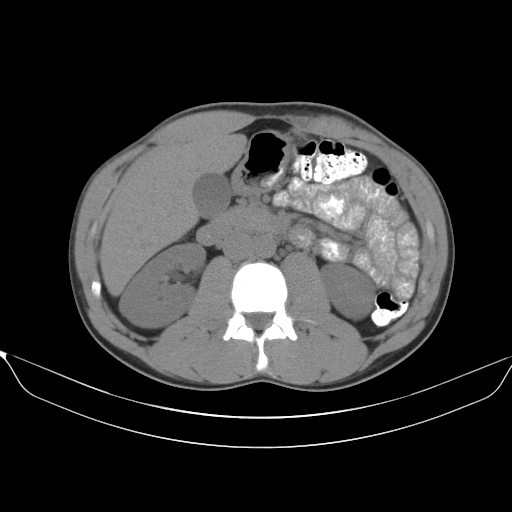
[im 73/135  soft-tissue]
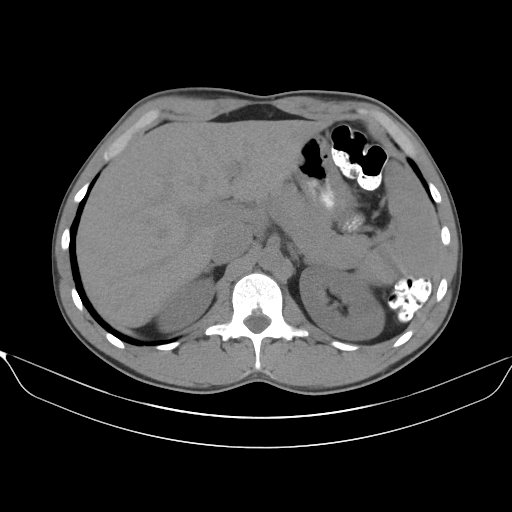
[im 83/135  soft-tissue]
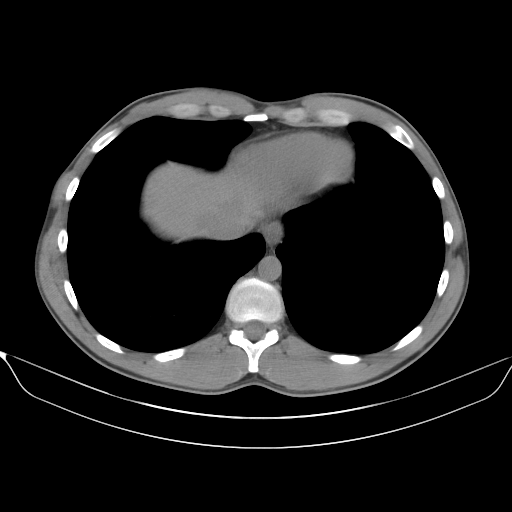
[im 93/135  soft-tissue]
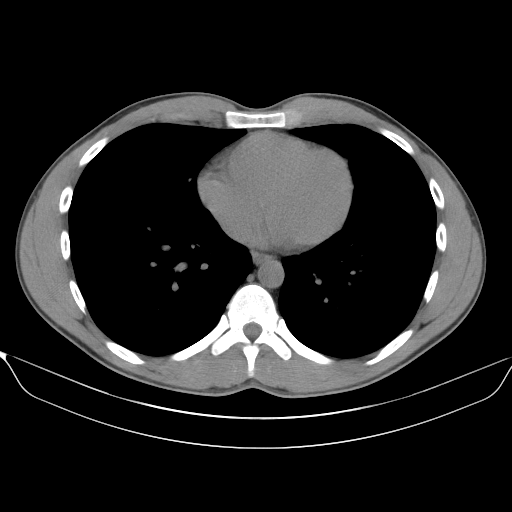
[im 93/135  lung]
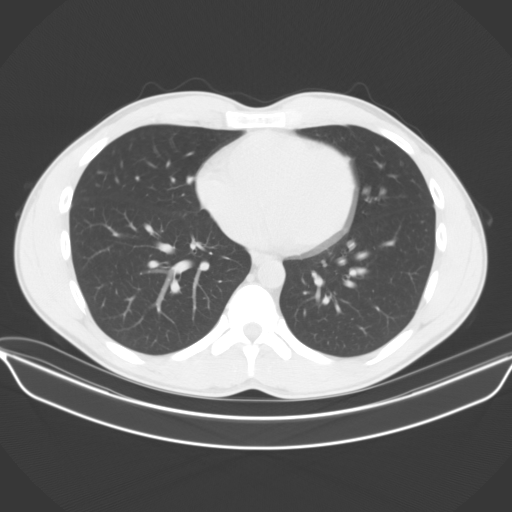
[im 93/135  bone]
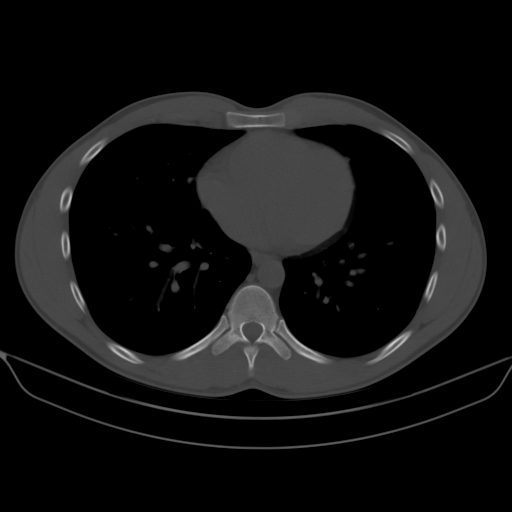
[im 104/135  soft-tissue]
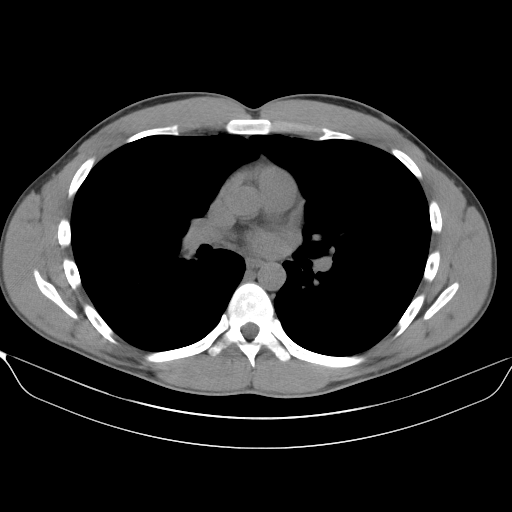
[im 104/135  lung]
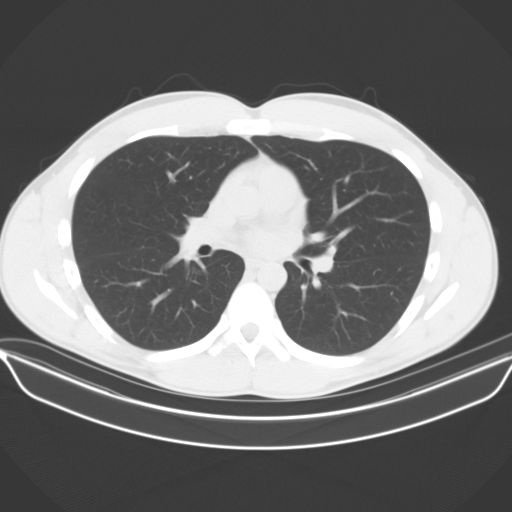
[im 114/135  soft-tissue]
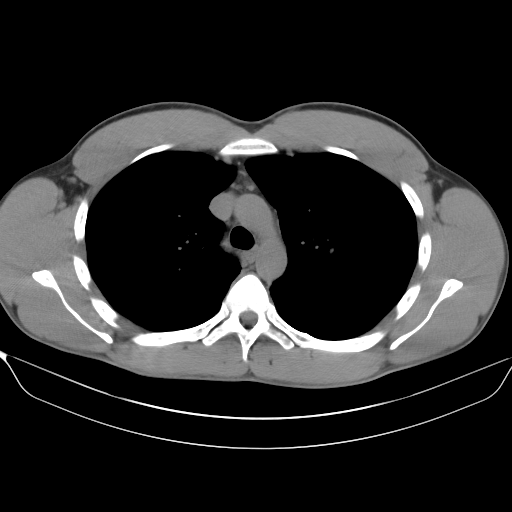
[im 114/135  lung]
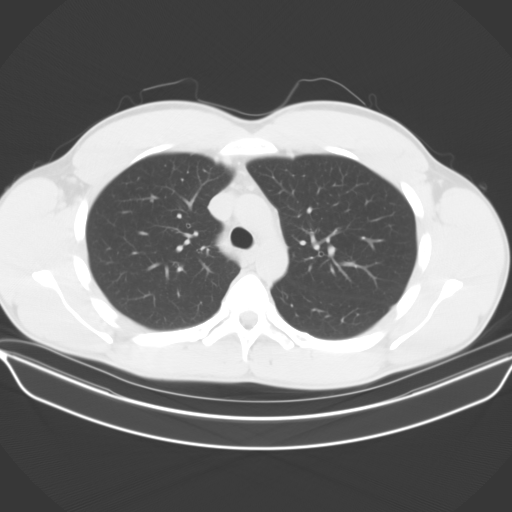
[im 124/135  soft-tissue]
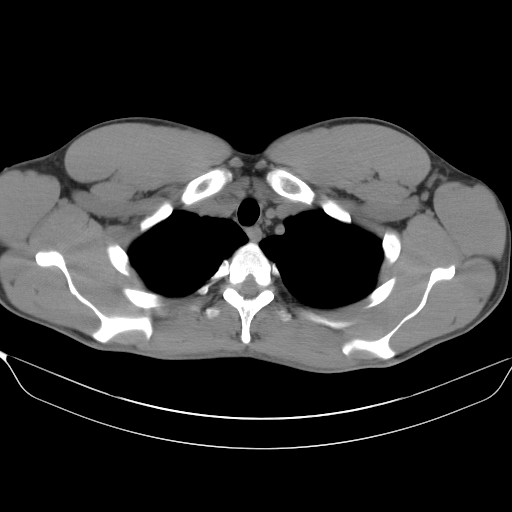
[im 124/135  lung]
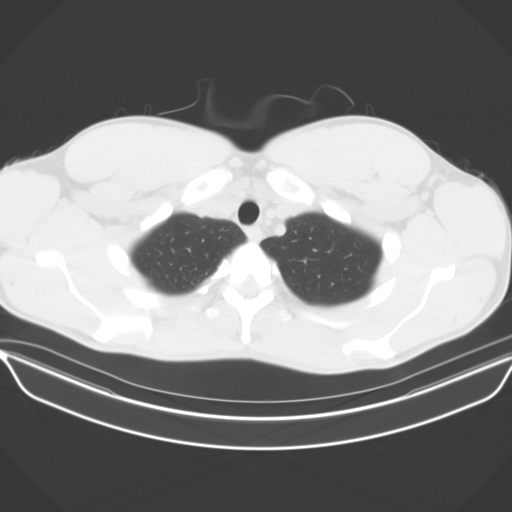

[12 of 32 positions shown; findings below may reference images not displayed]

FINDINGS: CT CHEST FINDINGS

Cardiovascular: No acute findings.

Mediastinum/Lymph Nodes: No masses or pathologically enlarged lymph
nodes identified on this unenhanced exam.

Lungs/Pleura: No evidence of infiltrate, mass, or pleural effusion.

Musculoskeletal:  No suspicious bone lesions identified.

CT ABDOMEN AND PELVIS FINDINGS

Hepatobiliary: Probable tiny sub-cm cyst seen in anterior right
lobe. No other lesions visualized on this unenhanced exam.
Gallbladder is unremarkable. No evidence of biliary ductal
dilatation.

Pancreas: No mass or inflammatory changes identified on this
unenhanced exam.

Spleen:  Within normal limits in size.

Adrenals/Urinary Tract: A few tiny 1 mm renal calculi are seen
bilaterally. No evidence of ureteral calculi or hydronephrosis.
Unremarkable appearance of bladder.

Stomach/Bowel: No evidence of obstruction, inflammatory process, or
abnormal fluid collections. Normal appendix visualized.

Vascular/Lymphatic: No pathologically enlarged lymph nodes
identified. No abdominal aortic aneurysm.

Reproductive:  No masses or other significant abnormality.

Other:  None.

Musculoskeletal:  No suspicious bone lesions identified.
IMPRESSION: No acute findings within the chest, abdomen, or pelvis.

Tiny bilateral renal calculi. No evidence of ureteral calculi or
hydronephrosis.

## 2022-03-31 ENCOUNTER — Encounter: Payer: Self-pay | Admitting: Gastroenterology

## 2022-03-31 ENCOUNTER — Ambulatory Visit: Payer: Managed Care, Other (non HMO) | Admitting: Gastroenterology

## 2022-03-31 VITALS — BP 126/76 | HR 76 | Wt 174.0 lb

## 2022-03-31 DIAGNOSIS — F411 Generalized anxiety disorder: Secondary | ICD-10-CM

## 2022-03-31 DIAGNOSIS — R110 Nausea: Secondary | ICD-10-CM | POA: Diagnosis not present

## 2022-03-31 DIAGNOSIS — K580 Irritable bowel syndrome with diarrhea: Secondary | ICD-10-CM | POA: Diagnosis not present

## 2022-03-31 DIAGNOSIS — K642 Third degree hemorrhoids: Secondary | ICD-10-CM

## 2022-03-31 MED ORDER — DULOXETINE HCL 30 MG PO CPEP: 30.0000 mg | ORAL_CAPSULE | Freq: Every day | ORAL | 3 refills | Status: DC

## 2022-03-31 MED ORDER — RIFAXIMIN 550 MG PO TABS: 550.0000 mg | ORAL_TABLET | Freq: Three times a day (TID) | ORAL | 0 refills | Status: AC

## 2022-03-31 NOTE — Patient Instructions (Addendum)
If your blood pressure at your visit was 140/90 or greater, please contact your primary care physician to follow up on this.  _______________________________________________________  If you are age 29 or older, your body mass index should be between 23-30. Your Body mass index is 24.97 kg/m. If this is out of the aforementioned range listed, please consider follow up with your Primary Care Provider.  If you are age 61 or younger, your body mass index should be between 19-25. Your Body mass index is 24.97 kg/m. If this is out of the aformentioned range listed, please consider follow up with your Primary Care Provider.   ________________________________________________________  The  GI providers would like to encourage you to use [facility] to communicate with providers for non-urgent requests or questions.  Due to long hold times on the telephone, sending your provider a message by [facility] may be a faster and more efficient way to get a response.  Please allow 48 business hours for a response.  Please remember that this is for non-urgent requests.  _______________________________________________________  Thayer Jew PROCEDURE    FOLLOW-UP CARE   The procedure you have had should have been relatively painless since the banding of the area involved does not have nerve endings and there is no pain sensation.  The rubber band cuts off the blood supply to the hemorrhoid and the band may fall off as soon as 48 hours after the banding (the band may occasionally be seen in the toilet bowl following a bowel movement). You may notice a temporary feeling of fullness in the rectum which should respond adequately to plain Tylenol or Motrin.  Following the banding, avoid strenuous exercise that evening and resume full activity the next day.  A sitz bath (soaking in a warm tub) or bidet is soothing, and can be useful for cleansing the area after bowel movements.     To avoid constipation, take  two tablespoons of natural wheat bran, natural oat bran, flax, Benefiber or any over the counter fiber supplement and increase your water intake to 7-8 glasses daily.    Unless you have been prescribed anorectal medication, do not put anything inside your rectum for two weeks: No suppositories, enemas, fingers, etc.  Occasionally, you may have more bleeding than usual after the banding procedure.  This is often from the untreated hemorrhoids rather than the treated one.  Don't be concerned if there is a tablespoon or so of blood.  If there is more blood than this, lie flat with your bottom higher than your head and apply an ice pack to the area. If the bleeding does not stop within a half an hour or if you feel faint, call our office at [phone] or go to the emergency room.  Problems are not common; however, if there is a substantial amount of bleeding, severe pain, chills, fever or difficulty passing urine (very rare) or other problems, you should call us at [phone] or report to the nearest emergency room.  Do not stay seated continuously for more than 2-3 hours for a day or two after the procedure.  Tighten your buttock muscles 10-15 times every two hours and take 10-15 deep breaths every 1-2 hours.  Do not spend more than a few minutes on the toilet if you cannot empty your bowel; instead re-visit the toilet at a later time.  We have sent the following medications to your pharmacy for you to pick up at your convenience: Cymbalta 30 mg: Take once daily  Xifaxan '550mg'$ : Take three times daily for 2 weeks   You have been scheduled for a follow up appointment on Wednesday, 05-14-22 at 3:40 pm. Please arrive 10 minutes early for registration.   Thank you for entrusting me with your care and for choosing [facility], Dr. Cannelton Cellar

## 2022-03-31 NOTE — Progress Notes (Signed)
HPI :  26 year old male here for follow-up visit for internal hemorrhoids, he also has IBS which she has dealt with for some time.  Specifically he was referred here for hemorrhoid banding.  He has had a colonoscopy previously which showed hemorrhoids as a source for his bleeding.  He has a grade 3 prolapse at this point in time, getting worse over time with blood in the majority of his stools with irritation in his rectum from the hemorrhoids.  We performed hemorrhoid banding for him on January 29, left lateral hemorrhoid banded.  He unfortunately had some sensitivity and came back later that day for adjustment of the band.  He responded quite well to adjustment, it did not need to be removed.  He seemed to tolerate after adjustment.  He thinks overall his hemorrhoid symptoms are somewhat better than previous but he still having some bleeding intermittently.  We had discussed placing him on some Citrucel as opposed to a different fiber supplement he was taking, he had a lot of gas with that.  He states the Citrucel seem to cause looser stools and he did not tolerated too much.  In fact he is not having any constipation or straining at all right now.  He had questions about Elavil and his IBS in general and is requesting help with that.  I looked through his chart.  He had been on Elavil titrated upwards of 150 mg/day.  He was on that for a few months and states it really was not helping him and he titrated down and stopped it about 3 weeks ago.  He states he continues to have a lot of bloating in his abdomen.  He has abdominal cramping and a few loose stools per day.  Recall he has been tested negative for celiac disease in the past, has had an EGD, colonoscopy, and CT scan as part of his workup previously.  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  He has anxiety and stress he has a hard time dealing with and states that Elavil did not help too much with that.  He inquires about  other options.   He reports he is also on Nexium and is not sure if it is really helping anything.  He denies any reflux symptoms at this time nor in the past.  He does have some occasional nausea which she has had for some time.  Uses Zofran as needed but does not take it much.  Colonoscopy 06/12/2020 - Dr. Rush Landmark - grade II hemorrhoids    Past Medical History:  Diagnosis Date   Hemorrhoids    IBS (irritable bowel syndrome) 2022     Past Surgical History:  Procedure Laterality Date   COLONOSCOPY     WISDOM TOOTH EXTRACTION     Family History  Problem Relation Age of Onset   Arthritis Father    Asthma Father    Heart disease Father    High blood pressure Father    Colon polyps Father    Diabetes Father    Colon cancer Neg Hx    Esophageal cancer Neg Hx    Inflammatory bowel disease Neg Hx    Liver disease Neg Hx    Pancreatic cancer Neg Hx    Rectal cancer Neg Hx    Stomach cancer Neg Hx    Social History   Tobacco Use   Smoking status: Never   Smokeless tobacco: Never  Vaping Use   Vaping Use:  Never used  Substance Use Topics   Alcohol use: Yes    Comment: rare   Drug use: Never   Current Outpatient Medications  Medication Sig Dispense Refill   augmented betamethasone dipropionate (DIPROLENE-AF) 0.05 % ointment APPLY TOPICALLY TWICE A DAY 50 g 0   DULoxetine (CYMBALTA) 30 MG capsule Take 1 capsule (30 mg total) by mouth daily. 30 capsule 3   esomeprazole (NEXIUM) 40 MG capsule TAKE 1 CAPSULE (40 MG TOTAL) BY MOUTH DAILY AT 12 NOON. 90 capsule 1   levothyroxine (SYNTHROID) 50 MCG tablet Take 1 tablet (50 mcg total) by mouth daily. 90 tablet 3   Multiple Vitamin (MULTIVITAMIN) tablet Take 1 tablet by mouth daily.     ondansetron (ZOFRAN) 4 MG tablet Take 2 tablets (8 mg total) by mouth every 8 (eight) hours as needed for nausea or vomiting. 30 tablet 1   rifaximin (XIFAXAN) 550 MG TABS tablet Take 1 tablet (550 mg total) by mouth 3 (three) times daily for 14  days. 42 tablet 0   No current facility-administered medications for this visit.   No Known Allergies   Review of Systems: All systems reviewed and negative except where noted in HPI.    No results found.  Physical Exam: BP 126/76   Pulse 76   Wt 174 lb (78.9 kg)   BMI 24.97 kg/m  Constitutional: Pleasant,well-developed, male in no acute distress. Neurological: Alert and oriented to person place and time. Psychiatric: Normal mood and affect. Behavior is normal.   ASSESSMENT: 27 y.o. male here for assessment of the following  1. Grade III hemorrhoids   2. Irritable bowel syndrome with diarrhea   3. Anxiety state   4. Nausea without vomiting    He wished to proceed with additional hemorrhoid banding today.  Right posterior hemorrhoid banded and adjusted following initial placement for some sensitivity he developed in the office today, he tolerated it well.  Precautions given following banding.  Discussed other options for his bowels.  Recommend some IBgard to use as needed for cramping but ultimately reviewed options to treat both IBS and his anxiety.  He has a lot of pain component to his symptoms and bloating.  I think he may be a good candidate for Cymbalta for these issues and anxiety.  Will start 30 mg daily now that he has been off TCA for a few weeks.  Give 4-week trial with a refill and titrate up as he tolerates.  Otherwise given his significant gas and bloating with loose stools, he may be a good candidate for rifaximin as well.  Will await his course with Cymbalta for few weeks but then try rifaximin 550 mg 3 times daily for 2 weeks to see if this will help his bloating.  He will continue Zofran and can use it more frequently for nausea which she is really not taking much of.  I will see him back in a few weeks in the office for reassessment for his hemorrhoids and additional banding as needed.  PLAN: - RP hemorrhoid banded today - patient self - stopped Elavil a few  weeks ago  - discussed options - start Cymbalta '30mg'$  / day - prescription for Rifaximin '550mg'$  TID for 2 weeks to treat IBD - Zofran '4mg'$  every 8 hours PRN nausea - f/u 2-4 weeks for final hemorrhoid banding, can then f/u with Dr. Rush Landmark his primary GI thereafter  Jolly Mango, MD [facility]   PROCEDURE NOTE: The patient presents with symptomatic grade III  hemorrhoids, requesting rubber band ligation of his/her hemorrhoidal disease.  All risks, benefits and alternative forms of therapy were described and informed consent was obtained.   The anorectum was pre-medicated with 0.125% nitroglycerin The decision was made to band the RP internal hemorrhoid, and the Hendricks was used to perform band ligation without complication.  Digital anorectal examination was then performed to assure proper positioning of the band, and to adjust the banded tissue as required. It was adjusted to slightly loosed it as he developed some mild discomfort post banding  The patient was discharged home without pain or other issues.  Dietary and behavioral recommendations were given and along with follow-up instructions.      The patient will return in 2-4 weeks for  follow-up and possible additional banding as required. No complications were encountered and the patient tolerated the procedure well.   Jolly Mango, MD [facility]

## 2022-04-27 ENCOUNTER — Other Ambulatory Visit: Payer: Self-pay | Admitting: Gastroenterology

## 2022-05-05 ENCOUNTER — Other Ambulatory Visit: Payer: Self-pay | Admitting: Gastroenterology

## 2022-05-14 ENCOUNTER — Encounter: Payer: Managed Care, Other (non HMO) | Admitting: Gastroenterology

## 2022-07-09 ENCOUNTER — Encounter: Payer: Self-pay | Admitting: Gastroenterology

## 2022-07-09 NOTE — Progress Notes (Deleted)
27 year old male here for follow-up visit for internal hemorrhoids, he also has IBS which she has dealt with for some time.   Specifically he was referred here for hemorrhoid banding.  He has had a colonoscopy previously which showed hemorrhoids as a source for his bleeding.  He has a grade 3 prolapse at this point in time, getting worse over time with blood in the majority of his stools with irritation in his rectum from the hemorrhoids.  We performed hemorrhoid banding for him on January 29, left lateral hemorrhoid banded.  He unfortunately had some sensitivity and came back later that day for adjustment of the band.  He responded quite well to adjustment, it did not need to be removed.  He seemed to tolerate after adjustment.  He thinks overall his hemorrhoid symptoms are somewhat better than previous but he still having some bleeding intermittently.   We had discussed placing him on some Citrucel as opposed to a different fiber supplement he was taking, he had a lot of gas with that.  He states the Citrucel seem to cause looser stools and he did not tolerated too much.  In fact he is not having any constipation or straining at all right now.  He had questions about Elavil and his IBS in general and is requesting help with that.  I looked through his chart.  He had been on Elavil titrated upwards of 150 mg/day.  He was on that for a few months and states it really was not helping him and he titrated down and stopped it about 3 weeks ago.  He states he continues to have a lot of bloating in his abdomen.  He has abdominal cramping and a few loose stools per day.  Recall he has been tested negative for celiac disease in the past, has had an EGD, colonoscopy, and CT scan as part of his workup previously.  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  He has anxiety and stress he has a hard time dealing with and states that Elavil did not help too much with that.  He inquires about other  options.   He reports he is also on Nexium and is not sure if it is really helping anything.  He denies any reflux symptoms at this time nor in the past.  He does have some occasional nausea which she has had for some time.  Uses Zofran as needed but does not take it much.   Colonoscopy 06/12/2020 - Dr. Meridee Score - grade II hemorrhoids   27 y.o. male here for assessment of the following   1. Grade III hemorrhoids   2. Irritable bowel syndrome with diarrhea   3. Anxiety state   4. Nausea without vomiting     He wished to proceed with additional hemorrhoid banding today.  Right posterior hemorrhoid banded and adjusted following initial placement for some sensitivity he developed in the office today, he tolerated it well.  Precautions given following banding.   Discussed other options for his bowels.  Recommend some IBgard to use as needed for cramping but ultimately reviewed options to treat both IBS and his anxiety.  He has a lot of pain component to his symptoms and bloating.  I think he may be a good candidate for Cymbalta for these issues and anxiety.  Will start 30 mg daily now that he has been off TCA for a few weeks.  Give 4-week trial with a refill and titrate up as  he tolerates.  Otherwise given his significant gas and bloating with loose stools, he may be a good candidate for rifaximin as well.  Will await his course with Cymbalta for few weeks but then try rifaximin 550 mg 3 times daily for 2 weeks to see if this will help his bloating.  He will continue Zofran and can use it more frequently for nausea which she is really not taking much of.   I will see him back in a few weeks in the office for reassessment for his hemorrhoids and additional banding as needed.   PLAN: - RP hemorrhoid banded today - patient self - stopped Elavil a few weeks ago  - discussed options - start Cymbalta 30mg  / day - prescription for Rifaximin 550mg  TID for 2 weeks to treat IBD - Zofran 4mg  every 8 hours  PRN nausea - f/u 2-4 weeks for final hemorrhoid banding, can then f/u with Dr. Meridee Score his primary GI thereafter

## 2022-08-15 ENCOUNTER — Other Ambulatory Visit: Payer: Self-pay | Admitting: Family Medicine

## 2022-10-03 ENCOUNTER — Other Ambulatory Visit: Payer: Self-pay | Admitting: Family Medicine

## 2023-05-26 ENCOUNTER — Ambulatory Visit: Payer: Self-pay | Admitting: Gastroenterology

## 2023-05-26 ENCOUNTER — Encounter: Payer: Self-pay | Admitting: Gastroenterology

## 2023-05-26 VITALS — BP 112/68 | HR 70 | Ht 70.0 in | Wt 176.2 lb

## 2023-05-26 DIAGNOSIS — K580 Irritable bowel syndrome with diarrhea: Secondary | ICD-10-CM

## 2023-05-26 DIAGNOSIS — R1030 Lower abdominal pain, unspecified: Secondary | ICD-10-CM | POA: Diagnosis not present

## 2023-05-26 DIAGNOSIS — R197 Diarrhea, unspecified: Secondary | ICD-10-CM

## 2023-05-26 DIAGNOSIS — F411 Generalized anxiety disorder: Secondary | ICD-10-CM | POA: Insufficient documentation

## 2023-05-26 NOTE — Progress Notes (Signed)
 Attending Physician's Attestation   I have reviewed the chart.   I agree with the Advanced Practitioner's note, impression, and recommendations with any updates as below. SIBO breath testing reasonable.  Viberzi reasonable for consideration as well.   Yong Henle, MD [facility] Advanced Endoscopy Office # [phone]

## 2023-05-26 NOTE — Progress Notes (Signed)
 05/26/2023 Randy Mayer [id number] [birth year]   HISTORY OF PRESENT ILLNESS: This is a 28 year old male who is a patient of Dr. Marolyn Sis.  He was last seen by me in January 2024.  After that he did have 2 hemorrhoid bandings with Dr. General Kenner.  Has not been seen since that time.  He tells me that he continues to have GI symptoms every day, consistent with IBS-D.  Has lower abdominal cramping.  Multiple episodes of loose stools a day, 3 times already today, with urgency.  Says that his stools are type 6-7 on the Bristol stool scale.  Was given SIBO breath test last year, but says he lost his insurance for a little while, etc. so never performed that.  As noted before, he had been given amitriptyline  150 mg and took that for a while, but it did not seem to work so he eventually titrated/tapered off of that.  His PCP recently gave him duloxetine , but he never took that either.  Really wants to stay away from the antidepressants, etc. until last resort.  He is on thyroid  medication and follows with endocrinology, Dr. Balan for that with recent labs.  He had a CT scan in 2022 for similar complaints that was unremarkable for cause of his symptoms.  Once again, his symptoms are longstanding.  May 2022 EGD and colonoscopy  - No gross lesions in esophagus. Biopsied. - Z-line regular, 42 cm from the incisors. - Erythematous mucosa in the stomach. Biopsied. - No gross lesions in the duodenal bulb, in the first portion of the duodenum and in the second portion of the duodenum. Biopsied.   - Hemorrhoids found on digital rectal exam. - The examined portion of the ileum was normal. Biopsied. - Normal mucosa in the entire examined colon. Biopsied. - Non-bleeding non-thrombosed internal hemorrhoids.   Diagnosis 1. Surgical [P], duodenal - BENIGN SMALL BOWEL MUCOSA. - NO VILLOUS BLUNTING OR INCREASE IN INTRAEPITHELIAL LYMPHOCYTES. - NO DYSPLASIA OR MALIGNANCY. 2. Surgical [P], gastric - MILD  CHRONIC GASTRITIS. - WARTHIN-STARRY IS NEGATIVE FOR HELICOBACTER PYLORI. - NO INTESTINAL METAPLASIA, DYSPLASIA, OR MALIGNANCY. 3. Surgical [P], esophageal random - BENIGN SQUAMOUS MUCOSA. - NO INCREASE IN INTRAEPITHELIAL EOSINOPHILS. - NO INTESTINAL METAPLASIA, DYSPLASIA, OR MALIGNANCY. 4. Surgical [P], small bowel, terminal ileum - BENIGN SMALL BOWEL MUCOSA. - NO ACTIVE INFLAMMATION. - NO DYSPLASIA OR MALIGNANCY. 5. Surgical [P], colon nos, random sites - BENIGN COLONIC MUCOSA. - NO ACTIVE INFLAMMATION OR EVIDENCE OF MICROSCOPIC COLITIS. - NO DYSPLASIA OR MALIGNANCY.    Past Medical History:  Diagnosis Date   Hemorrhoids    IBS (irritable bowel syndrome) 2022   Past Surgical History:  Procedure Laterality Date   COLONOSCOPY     WISDOM TOOTH EXTRACTION      reports that he has never smoked. He has never used smokeless tobacco. He reports current alcohol use. He reports that he does not use drugs. family history includes Arthritis in his father; Asthma in his father; Colon polyps in his father; Diabetes in his father; Heart disease in his father; High blood pressure in his father. No Known Allergies    Outpatient Encounter Medications as of 05/26/2023  Medication Sig   levothyroxine  (SYNTHROID ) 50 MCG tablet Take 1 tablet (50 mcg total) by mouth daily.   Multiple Vitamin (MULTIVITAMIN) tablet Take 1 tablet by mouth daily.   ondansetron  (ZOFRAN ) 4 MG tablet Take 2 tablets (8 mg total) by mouth every 8 (eight) hours as needed for nausea or vomiting.   [  DISCONTINUED] augmented betamethasone  dipropionate (DIPROLENE -AF) 0.05 % ointment APPLY TOPICALLY TWICE A DAY   [DISCONTINUED] DULoxetine  (CYMBALTA ) 30 MG capsule Take 1 capsule (30 mg total) by mouth daily.   [DISCONTINUED] esomeprazole  (NEXIUM ) 40 MG capsule TAKE 1 CAPSULE BY MOUTH DAILY AT 12 NOON.   No facility-administered encounter medications on file as of 05/26/2023.    REVIEW OF SYSTEMS  : All other systems reviewed and  negative except where noted in the History of Present Illness.   PHYSICAL EXAM: BP 112/68   Pulse 70   Ht 5\' 10"  (1.778 m)   Wt 176 lb 3.2 oz (79.9 kg)   BMI 25.28 kg/m  General: Well developed white male in no acute distress Head: Normocephalic and atraumatic Eyes:  Sclerae anicteric, conjunctiva pink. Ears: Normal auditory acuity Lungs: Clear throughout to auscultation; no W/R/R. Heart: Regular rate and rhythm; no M/R/G. Abdomen: Soft, non-distended.  BS present.  Non-tender. Musculoskeletal: Symmetrical with no gross deformities  Skin: No lesions on visible extremities Extremities: No edema  Neurological: Alert oriented x 4, grossly non-focal Psychological:  Alert and cooperative. Normal mood and affect  ASSESSMENT AND PLAN: *28 year old male who with ongoing, longstanding complaints of lower abdominal pain/cramping and diarrhea, suspected diagnosis of IBS-D: He has had both EGD and colonoscopy, small bowel biopsies negative for celiac disease.  He has had CT scan of the abdomen and pelvis with contrast.  Tried amitriptyline  150 milligrams in the past without improvement so eventually tapered off of that.  He had been given SIBO breath test, but had lost his insurance for little while, etc. so did not perform that.  I think that we should go ahead and proceed with that since he is willing to rule that out.  Otherwise his PCP had tried to give him duloxetine  30 mg, but he never took the medication.  I think that might be a good option for him if he is eventually willing.  Otherwise we could try Viberzi first.  In the interim can use Imodium even starting with a half a capsule/tablet daily and titrate that.  Will make further recommendations pending results of the SIBO breath test.   CC:  Rodney Clamp, MD

## 2023-05-26 NOTE — Patient Instructions (Addendum)
 You have been given a testing kit to check for small intestine bacterial overgrowth (SIBO) which is completed by a company named [facility]. Make sure to return your test in the mail using the return mailing label given to you along with the kit. The test order, your demographic and insurance information have all already been sent to the company. [facility] will collect an upfront charge of $99.74 for commercial insurance plans and $209.74 if you are paying cash. Make sure to discuss with [facility] PRIOR to having the test to see if they have gotten information from your insurance company as to how much your testing will cost out of pocket, if any. Please contact [facility] at phone number [phone] to get instructions regarding how to perform the test as our office is unable to give specific testing instructions.

## 2023-06-18 ENCOUNTER — Telehealth: Payer: Self-pay | Admitting: *Deleted

## 2023-06-18 MED ORDER — RIFAXIMIN 550 MG PO TABS: 550.0000 mg | ORAL_TABLET | Freq: Three times a day (TID) | ORAL | 0 refills | Status: AC

## 2023-06-18 NOTE — Telephone Encounter (Signed)
 Left message for patient to call office. Sent script for Xifaxan  into pharmacy.

## 2023-06-18 NOTE — Telephone Encounter (Signed)
-----   Message from Athol D. Zehr sent at 06/17/2023  5:22 PM EDT ----- Please let him know that his SIBO breath test shows that bacterial overgrowth is suspected.  Had quite high levels of hydrogen.  Please send Xifaxan  550 mg 3 times daily for 14 days.  After completion of the antibiotic, please have him update us  via MyChart in a couple months to see how he is doing.  Thank you,  Jess

## 2023-06-18 NOTE — Telephone Encounter (Signed)
Informed patients wife of results

## 2023-06-23 ENCOUNTER — Encounter: Payer: Self-pay | Admitting: Gastroenterology

## 2023-07-15 ENCOUNTER — Other Ambulatory Visit: Payer: Self-pay

## 2023-07-15 MED ORDER — VIBERZI 100 MG PO TABS: 100.0000 mg | ORAL_TABLET | Freq: Two times a day (BID) | ORAL | 0 refills | Status: AC

## 2024-03-01 ENCOUNTER — Ambulatory Visit: Admitting: Family Medicine
